# Patient Record
Sex: Female | Born: 1980 | Race: White | Hispanic: No | Marital: Single | Smoking: Former smoker
Health system: Southern US, Community
[De-identification: ages and names within clinical notes are randomized; demographics above are authoritative.]

## PROBLEM LIST (undated history)

## (undated) DIAGNOSIS — K219 Gastro-esophageal reflux disease without esophagitis: Secondary | ICD-10-CM

## (undated) HISTORY — DX: Gastro-esophageal reflux disease without esophagitis: K21.9

---
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
## 1999-10-10 ENCOUNTER — Other Ambulatory Visit: Admission: RE | Admit: 1999-10-10 | Discharge: 1999-10-10 | Payer: Self-pay | Admitting: Obstetrics and Gynecology

## 1999-11-22 ENCOUNTER — Encounter: Payer: Self-pay | Admitting: Obstetrics & Gynecology

## 1999-11-22 ENCOUNTER — Inpatient Hospital Stay (HOSPITAL_COMMUNITY): Admission: AD | Admit: 1999-11-22 | Discharge: 1999-11-22 | Payer: Self-pay | Admitting: Obstetrics & Gynecology

## 2000-05-10 ENCOUNTER — Inpatient Hospital Stay (HOSPITAL_COMMUNITY): Admission: AD | Admit: 2000-05-10 | Discharge: 2000-05-13 | Payer: Self-pay | Admitting: Obstetrics and Gynecology

## 2000-06-24 ENCOUNTER — Other Ambulatory Visit: Admission: RE | Admit: 2000-06-24 | Discharge: 2000-06-24 | Payer: Self-pay | Admitting: Obstetrics and Gynecology

## 2001-03-16 ENCOUNTER — Emergency Department (HOSPITAL_COMMUNITY): Admission: EM | Admit: 2001-03-16 | Discharge: 2001-03-16 | Payer: Self-pay | Admitting: *Deleted

## 2001-03-16 ENCOUNTER — Encounter: Payer: Self-pay | Admitting: *Deleted

## 2005-05-23 ENCOUNTER — Emergency Department (HOSPITAL_COMMUNITY): Admission: EM | Admit: 2005-05-23 | Discharge: 2005-05-23 | Payer: Self-pay | Admitting: Family Medicine

## 2005-12-16 ENCOUNTER — Inpatient Hospital Stay (HOSPITAL_COMMUNITY): Admission: AD | Admit: 2005-12-16 | Discharge: 2005-12-16 | Payer: Self-pay | Admitting: Gynecology

## 2005-12-19 ENCOUNTER — Inpatient Hospital Stay (HOSPITAL_COMMUNITY): Admission: AD | Admit: 2005-12-19 | Discharge: 2005-12-19 | Payer: Self-pay | Admitting: Gynecology

## 2011-04-21 ENCOUNTER — Emergency Department (HOSPITAL_COMMUNITY)
Admission: EM | Admit: 2011-04-21 | Discharge: 2011-04-21 | Disposition: A | Discharge status: Home / Self Care | Payer: BC Managed Care – PPO | Attending: Emergency Medicine | Admitting: Emergency Medicine

## 2011-04-21 ENCOUNTER — Encounter: Payer: Self-pay | Admitting: *Deleted

## 2011-04-21 DIAGNOSIS — R404 Transient alteration of awareness: Secondary | ICD-10-CM | POA: Insufficient documentation

## 2011-04-21 DIAGNOSIS — F10929 Alcohol use, unspecified with intoxication, unspecified: Secondary | ICD-10-CM

## 2011-04-21 DIAGNOSIS — F1010 Alcohol abuse, uncomplicated: Secondary | ICD-10-CM | POA: Insufficient documentation

## 2011-04-21 DIAGNOSIS — R112 Nausea with vomiting, unspecified: Secondary | ICD-10-CM | POA: Insufficient documentation

## 2011-04-21 LAB — RAPID URINE DRUG SCREEN, HOSP PERFORMED
Amphetamines: NOT DETECTED
Benzodiazepines: NOT DETECTED
Cocaine: NOT DETECTED

## 2011-04-21 LAB — ETHANOL: Alcohol, Ethyl (B): 184 mg/dL — ABNORMAL HIGH (ref 0–11)

## 2011-04-21 LAB — PREGNANCY, URINE: Preg Test, Ur: NEGATIVE

## 2011-04-21 MED ORDER — SODIUM CHLORIDE 0.9 % IV BOLUS (SEPSIS)
1000.0000 mL | Freq: Once | INTRAVENOUS | Status: AC
  Administered 2011-04-21: 1000 mL via INTRAVENOUS

## 2011-04-21 MED ORDER — ONDANSETRON HCL 4 MG PO TABS: 4.0000 mg | ORAL_TABLET | Freq: Four times a day (QID) | ORAL | Status: AC

## 2011-04-21 MED ORDER — ONDANSETRON HCL 4 MG/2ML IJ SOLN
4.0000 mg | Freq: Once | INTRAMUSCULAR | Status: AC
  Administered 2011-04-21: 4 mg via INTRAVENOUS
  Filled 2011-04-21: qty 2

## 2011-04-21 NOTE — ED Provider Notes (Signed)
History     CSN: [id number] Arrival date & time: 04/21/2011  3:23 AM   First MD Initiated Contact with Patient 04/21/11 [phone]      Chief Complaint  Patient presents with  . Alcohol Intoxication    (Consider location/radiation/quality/duration/timing/severity/associated sxs/prior treatment) HPI Comments: Patient here after going to a company Christmas party, then going out with co-workers afterwards where she continued to drink an unknown amount of alcohol.  Her sister reports that she normally does not drink a lot and so this is not normal.  She reports that she admitted to drinking shots as well. She states that when she went to pick her up, she was quite intoxicated, she became concerned because the patient was becoming less and less coherent.  Upon arrival here patient with active vomiting while in triage.  I am able to arouse the patient, but she will not hold a conversation.  Patient is a 30 y.o. female presenting with intoxication. The history is provided by a relative. The history is limited by the condition of the patient. No language interpreter was used.  Alcohol Intoxication This is a new problem. The current episode started today. The problem occurs constantly. The problem has been unchanged. Associated symptoms include nausea and vomiting. Pertinent negatives include no abdominal pain. The symptoms are aggravated by drinking. She has tried nothing for the symptoms. The treatment provided no relief.  Alcohol Intoxication This is a new problem. The current episode started today. The problem occurs constantly. The problem has been unchanged. Pertinent negatives include no abdominal pain. The symptoms are aggravated by drinking. She has tried nothing for the symptoms. The treatment provided no relief.    Past Medical History  Diagnosis Date  . Asthma     History reviewed. No pertinent past surgical history.  History reviewed. No pertinent family history.  History  Substance  Use Topics  . Smoking status: Not on file  . Smokeless tobacco: Not on file  . Alcohol Use: Yes    OB History    Grav Para Term Preterm Abortions TAB SAB Ect Mult Living                  Review of Systems  Unable to perform ROS: Mental status change  Gastrointestinal: Positive for nausea and vomiting. Negative for abdominal pain.    Allergies  Review of patient's allergies indicates no known allergies.  Home Medications  No current outpatient prescriptions on file.  BP 106/74  Pulse 95  Temp(Src) 98 F (36.7 C) (Oral)  Resp 18  SpO2 100%  Physical Exam  Nursing note and vitals reviewed. Constitutional: She appears well-developed and well-nourished.  HENT:  Head: Normocephalic and atraumatic.  Right Ear: External ear normal.  Left Ear: External ear normal.  Mouth/Throat: Oropharynx is clear and moist. No oropharyngeal exudate.  Eyes: Conjunctivae are normal. Pupils are equal, round, and reactive to light.  Neck: Normal range of motion. Neck supple.  Cardiovascular: Normal rate, regular rhythm and normal heart sounds.  Exam reveals no gallop and no friction rub.   No murmur heard. Pulmonary/Chest: Effort normal and breath sounds normal. She exhibits no tenderness.  Abdominal: Soft. Bowel sounds are normal. She exhibits no distension. There is no tenderness.  Musculoskeletal: Normal range of motion.  Neurological: No cranial nerve deficit.       somnulent but arouses with painful stimuli  Skin: Skin is warm and dry.    ED Course  Procedures (including critical care time)   Labs  Reviewed  ETHANOL  PREGNANCY, URINE  URINE RAPID DRUG SCREEN (HOSP PERFORMED)   No results found.   Alcohol Intoxication    MDM  Patient now awake and alert, is able to converse and ambulate and clinically sober.  Will discharge home with sister.        Izola Price [place], [place] 04/21/11 [phone]  Medical screening examination/treatment/procedure(s) were performed by  non-physician practitioner and as supervising physician I was immediately available for consultation/collaboration.   Sunnie Nielsen, MD 04/21/11 [phone]

## 2011-04-21 NOTE — ED Notes (Signed)
Pt in with alcohol intoxication, pt actively vomiting in triage, pt unable to sit up or answer questions, unsure of drug use or amount of ETOH

## 2011-05-11 ENCOUNTER — Telehealth (INDEPENDENT_AMBULATORY_CARE_PROVIDER_SITE_OTHER): Payer: Self-pay | Admitting: Physician Assistant

## 2011-05-11 MED ORDER — AZITHROMYCIN 250 MG PO TABS: ORAL_TABLET | ORAL | Status: AC

## 2011-05-11 NOTE — Telephone Encounter (Signed)
Terry Chang is complaining of a three week history of productive cough, worse in the past four days. She had a fever of 104 last night. She denies wheezing or shortness of breath as she has a history of asthma. I examined her in the office today and she has a productive cough but no stridor or increased work of breathing. Lungs show bilateral crackles that clear with coughing. No wheeze or decreased breath sounds. Given her persistent symptoms and recent fever, I will prescribe her a course of azithromycin. If she has worsening or no improvement of symptoms, we will recommend a chest xray.

## 2011-09-18 ENCOUNTER — Ambulatory Visit (INDEPENDENT_AMBULATORY_CARE_PROVIDER_SITE_OTHER): Payer: BC Managed Care – PPO | Admitting: Family Medicine

## 2011-09-18 ENCOUNTER — Encounter: Payer: Self-pay | Admitting: Family Medicine

## 2011-09-18 VITALS — BP 106/64 | HR 85 | Temp 98.3°F | Ht 62.0 in | Wt 130.2 lb

## 2011-09-18 DIAGNOSIS — Z3009 Encounter for other general counseling and advice on contraception: Secondary | ICD-10-CM

## 2011-09-18 DIAGNOSIS — J45909 Unspecified asthma, uncomplicated: Secondary | ICD-10-CM

## 2011-09-18 MED ORDER — BECLOMETHASONE DIPROPIONATE 40 MCG/ACT IN AERS: 2.0000 | INHALATION_SPRAY | Freq: Two times a day (BID) | RESPIRATORY_TRACT | Status: DC

## 2011-09-18 MED ORDER — MONTELUKAST SODIUM 10 MG PO TABS: 10.0000 mg | ORAL_TABLET | Freq: Every day | ORAL | Status: DC

## 2011-09-18 MED ORDER — ALBUTEROL SULFATE HFA 108 (90 BASE) MCG/ACT IN AERS: 2.0000 | INHALATION_SPRAY | Freq: Four times a day (QID) | RESPIRATORY_TRACT | Status: DC | PRN

## 2011-09-18 NOTE — Progress Notes (Signed)
  Subjective:     Terry Chang is a 31 y.o. female who presents for evaluation of asthma. The patient has been previously diagnosed with asthma. @[facility] @ was diagnosed with asthma at age 26 . The patient has been admitted to the hospital for asthma; the last admission was about 1 year ago--[facility]. The patient is not currently have symptoms / an exacerbation. The patient has been having episodes for approximately 2 years. Symptoms in previous episodes have included chest pain, chest tightness, dyspnea, non-productive cough and wheezing, and typically last 2 days. Previous episodes have been triggered by animal dander, dust, pollens and strong odors. Treatments tried during prior episodes include short-acting inhaled beta-adrenergic agonists, which usually provides some relief of symptoms.   Current Disease Severity Terry Chang has monthly daytime asthma symptoms. She has monthly nighttime asthma symptoms. The patient is using short-acting beta agonists for symptom control more than 2 days per week but not more than once a day. She has exacerbations requiring oral systemic corticosteroids 1 times per year. Current limitations in activity from asthma: unable to start exercising again. Number of days of school or work missed in the last month: 0. Number of urgent/emergent visit in last year: 0   The following portions of the patient's history were reviewed and updated as appropriate: allergies, current medications, past family history, past medical history, past social history, past surgical history and problem list.  Review of Systems As above     Objective:    Oxygen saturation 98% on room air BP 106/64  Pulse 85  Temp(Src) 98.3 F (36.8 C) (Oral)  Ht 5\' 2"  (1.575 m)  Wt 130 lb 3.2 oz (59.058 kg)  BMI 23.81 kg/m2  SpO2 98%  LMP 09/01/2011 General appearance: alert, cooperative, appears stated age and no distress Head: Normocephalic, without obvious abnormality, atraumatic Ears: normal TM's and  external ear canals both ears Nose: Nares normal. Septum midline. Mucosa normal. No drainage or sinus tenderness. Throat: lips, mucosa, and tongue normal; teeth and gums normal Neck: no adenopathy, supple, symmetrical, trachea midline and thyroid not enlarged, symmetric, no tenderness/mass/nodules Lungs: clear to auscultation bilaterally Heart: S1, S2 normal Lymph nodes: Cervical, supraclavicular, and axillary nodes normal.    Assessment:    Intermittent asthma. Apparent precipitants include emotional upset, exercise and upper respiratory infection. No treatment was given in the office    contraception--  rto for cpe and we can arrange for IUD if she decides she wants it Plan:    Medications: begin singulair, qvar, ventolin hfa. Discussed distinction between quick-relief and controlled medications. Discussed medication dosage, use, side effects, and goals of treatment in detail.   Discussed technique for using MDIs and/or nebulizer. Discussed monitoring symptoms and use of quick-relief medications and contacting us early in the course of exacerbations. f/u prn

## 2011-09-18 NOTE — Assessment & Plan Note (Signed)
Hospitalized about 1 year ago---- went through rescue inhaler in 2 weeks

## 2011-09-18 NOTE — Patient Instructions (Addendum)
Allergies, Generic Allergies may happen from anything your body is sensitive to. This may be food, medicines, pollens, chemicals, and nearly anything around you in everyday life that produces allergens. An allergen is anything that causes an allergy producing substance. Heredity is often a factor in causing these problems. This means you may have some of the same allergies as your parents. Food allergies happen in all age groups. Food allergies are some of the most severe and life threatening. Some common food allergies are cow's milk, seafood, eggs, nuts, wheat, and soybeans. SYMPTOMS   Swelling around the mouth.   An itchy red rash or hives.   Vomiting or diarrhea.   Difficulty breathing.  SEVERE ALLERGIC REACTIONS ARE LIFE-THREATENING. This reaction is called anaphylaxis. It can cause the mouth and throat to swell and cause difficulty with breathing and swallowing. In severe reactions only a trace amount of food (for example, peanut oil in a salad) may cause death within seconds. Seasonal allergies occur in all age groups. These are seasonal because they usually occur during the same season every year. They may be a reaction to molds, grass pollens, or tree pollens. Other causes of problems are house dust mite allergens, pet dander, and mold spores. The symptoms often consist of nasal congestion, a runny itchy nose associated with sneezing, and tearing itchy eyes. There is often an associated itching of the mouth and ears. The problems happen when you come in contact with pollens and other allergens. Allergens are the particles in the air that the body reacts to with an allergic reaction. This causes you to release allergic antibodies. Through a chain of events, these eventually cause you to release histamine into the blood stream. Although it is meant to be protective to the body, it is this release that causes your discomfort. This is why you were given anti-histamines to feel better. If you are  unable to pinpoint the offending allergen, it may be determined by skin or blood testing. Allergies cannot be cured but can be controlled with medicine. Hay fever is a collection of all or some of the seasonal allergy problems. It may often be treated with simple over-the-counter medicine such as diphenhydramine. Take medicine as directed. Do not drink alcohol or drive while taking this medicine. Check with your caregiver or package insert for child dosages. If these medicines are not effective, there are many new medicines your caregiver can prescribe. Stronger medicine such as nasal spray, eye drops, and corticosteroids may be used if the first things you try do not work well. Other treatments such as immunotherapy or desensitizing injections can be used if all else fails. Follow up with your caregiver if problems continue. These seasonal allergies are usually not life threatening. They are generally more of a nuisance that can often be handled using medicine. HOME CARE INSTRUCTIONS   If unsure what causes a reaction, keep a diary of foods eaten and symptoms that follow. Avoid foods that cause reactions.   If hives or rash are present:   Take medicine as directed.   You may use an over-the-counter antihistamine (diphenhydramine) for hives and itching as needed.   Apply cold compresses (cloths) to the skin or take baths in cool water. Avoid hot baths or showers. Heat will make a rash and itching worse.   If you are severely allergic:   Following a treatment for a severe reaction, hospitalization is often required for closer follow-up.   Wear a medic-alert bracelet or necklace stating the allergy.     You and your family must learn how to give adrenaline or use an anaphylaxis kit.   If you have had a severe reaction, always carry your anaphylaxis kit or EpiPen with you. Use this medicine as directed by your caregiver if a severe reaction is occurring. Failure to do so could have a fatal  outcome.  SEEK MEDICAL CARE IF:  You suspect a food allergy. Symptoms generally happen within 30 minutes of eating a food.   Your symptoms have not gone away within 2 days or are getting worse.   You develop new symptoms.   You want to retest yourself or your child with a food or drink you think causes an allergic reaction. Never do this if an anaphylactic reaction to that food or drink has happened before. Only do this under the care of a caregiver.  SEEK IMMEDIATE MEDICAL CARE IF:   You have difficulty breathing, are wheezing, or have a tight feeling in your chest or throat.   You have a swollen mouth, or you have hives, swelling, or itching all over your body.   You have had a severe reaction that has responded to your anaphylaxis kit or an EpiPen. These reactions may return when the medicine has worn off. These reactions should be considered life threatening.  MAKE SURE YOU:   Understand these instructions.   Will watch your condition.   Will get help right away if you are not doing well or get worse.  Document Released: 07/24/2002 Document Revised: 04/19/2011 Document Reviewed: 12/29/2007 [facility] Patient Information 2012 [place], [place].  Asthma, Adult Asthma is caused by narrowing of the air passages in the lungs. It may be triggered by pollen, dust, animal dander, molds, some foods, respiratory infections, exposure to smoke, exercise, emotional stress or other allergens (things that cause allergic reactions or allergies). Repeat attacks are common. HOME CARE INSTRUCTIONS   Use prescription medications as ordered by your caregiver.   Avoid pollen, dust, animal dander, molds, smoke and other things that cause attacks at home and at work.   You may have fewer attacks if you decrease dust in your home. Electrostatic air cleaners may help.   It may help to replace your pillows or mattress with materials less likely to cause allergies.   Talk to your caregiver about an  action plan for managing asthma attacks at home, including, the use of a peak flow meter which measures the severity of your asthma attack. An action plan can help minimize or stop the attack without having to seek medical care.   If you are not on a fluid restriction, drink 8 to 10 glasses of water each day.   Always have a plan prepared for seeking medical attention, including, calling your physician, accessing local emergency care, and calling 911 (in the [place] for a severe attack.   Discuss possible exercise routines with your caregiver.   If animal dander is the cause of asthma, you may need to get rid of pets.  SEEK MEDICAL CARE IF:   You have wheezing and shortness of breath even if taking medicine to prevent attacks.   You have muscle aches, chest pain or thickening of sputum.   Your sputum changes from clear or white to yellow, green, gray, or bloody.   You have any problems that may be related to the medicine you are taking (such as a rash, itching, swelling or trouble breathing).  SEEK IMMEDIATE MEDICAL CARE IF:   Your usual medicines do not stop your wheezing  or there is increased coughing and/or shortness of breath.   You have increased difficulty breathing.   You have a fever.  MAKE SURE YOU:   Understand these instructions.   Will watch your condition.   Will get help right away if you are not doing well or get worse.  Document Released: 04/30/2005 Document Revised: 04/19/2011 Document Reviewed: 12/17/2007 [facility] Patient Information 2012 [place], [place].  Intrauterine Device Information An intrauterine device (IUD) is inserted into your uterus and prevents pregnancy. There are 2 types of IUDs available:  Copper IUD. This type of IUD is wrapped in copper wire and is placed inside the uterus. Copper makes the uterus and fallopian tubes produce a fluid that kills sperm. The copper IUD can stay in place for 10 years.   Hormone IUD. This type of IUD contains the  hormone progestin (synthetic progesterone). The hormone thickens the cervical mucus and prevents sperm from entering the uterus, and it also thins the uterine lining to prevent implantation of a fertilized egg. The hormone can weaken or kill the sperm that get into the uterus. The hormone IUD can stay in place for 5 years.  Your caregiver will make sure you are a good candidate for a contraceptive IUD. Discuss with your caregiver the possible side effects. ADVANTAGES  It is highly effective, reversible, long-acting, and low maintenance.   There are no estrogen-related side effects.   An IUD can be used when breastfeeding.   It is not associated with weight gain.   It works immediately after insertion.   The copper IUD does not interfere with your female hormones.   The progesterone IUD can make heavy menstrual periods lighter.   The progesterone IUD can be used for 5 years.   The copper IUD can be used for 10 years.  DISADVANTAGES  The progesterone IUD can be associated with irregular bleeding patterns.   The copper IUD can make your menstrual flow heavier and more painful.   You may experience cramping and vaginal bleeding after insertion.  Document Released: 04/03/2004 Document Revised: 04/19/2011 Document Reviewed: 09/02/2010 [facility] Patient Information 2012 [place], [place].

## 2011-11-14 ENCOUNTER — Encounter: Payer: Self-pay | Admitting: Family Medicine

## 2011-11-14 ENCOUNTER — Ambulatory Visit (INDEPENDENT_AMBULATORY_CARE_PROVIDER_SITE_OTHER): Payer: BC Managed Care – PPO | Admitting: Family Medicine

## 2011-11-14 ENCOUNTER — Other Ambulatory Visit (HOSPITAL_COMMUNITY)
Admission: RE | Admit: 2011-11-14 | Discharge: 2011-11-14 | Disposition: A | Discharge status: Home / Self Care | Payer: BC Managed Care – PPO | Source: Ambulatory Visit | Attending: Family Medicine | Admitting: Family Medicine

## 2011-11-14 VITALS — BP 104/62 | HR 67 | Temp 98.3°F | Ht 62.0 in | Wt 128.2 lb

## 2011-11-14 DIAGNOSIS — Z0000 Encounter for general adult medical examination without abnormal findings: Secondary | ICD-10-CM

## 2011-11-14 DIAGNOSIS — IMO0001 Reserved for inherently not codable concepts without codable children: Secondary | ICD-10-CM

## 2011-11-14 DIAGNOSIS — Z124 Encounter for screening for malignant neoplasm of cervix: Secondary | ICD-10-CM

## 2011-11-14 DIAGNOSIS — Z309 Encounter for contraceptive management, unspecified: Secondary | ICD-10-CM

## 2011-11-14 DIAGNOSIS — Z01419 Encounter for gynecological examination (general) (routine) without abnormal findings: Secondary | ICD-10-CM | POA: Insufficient documentation

## 2011-11-14 LAB — CBC WITH DIFFERENTIAL/PLATELET
Basophils Absolute: 0 10*3/uL (ref 0.0–0.1)
Eosinophils Absolute: 0.1 10*3/uL (ref 0.0–0.7)
Hemoglobin: 12.8 g/dL (ref 12.0–15.0)
Lymphocytes Relative: 35.9 % (ref 12.0–46.0)
MCHC: 33.4 g/dL (ref 30.0–36.0)
Monocytes Relative: 5.9 % (ref 3.0–12.0)
Neutro Abs: 2.5 10*3/uL (ref 1.4–7.7)
Neutrophils Relative %: 55.6 % (ref 43.0–77.0)
Platelets: 301 10*3/uL (ref 150.0–400.0)
RDW: 12.4 % (ref 11.5–14.6)

## 2011-11-14 LAB — BASIC METABOLIC PANEL
BUN: 10 mg/dL (ref 6–23)
CO2: 28 mEq/L (ref 19–32)
Calcium: 9.5 mg/dL (ref 8.4–10.5)
Creatinine, Ser: 0.8 mg/dL (ref 0.4–1.2)
GFR: 84.17 mL/min (ref >60.00)
Glucose, Bld: 84 mg/dL (ref 70–99)
Sodium: 137 mEq/L (ref 135–145)

## 2011-11-14 LAB — POCT URINALYSIS DIPSTICK
Glucose, UA: NEGATIVE
Nitrite, UA: NEGATIVE
Protein, UA: NEGATIVE
Spec Grav, UA: 1.015
Urobilinogen, UA: 0.2

## 2011-11-14 LAB — HEPATIC FUNCTION PANEL
AST: 45 U/L — ABNORMAL HIGH (ref 0–37)
Albumin: 4.2 g/dL (ref 3.5–5.2)
Alkaline Phosphatase: 59 U/L (ref 39–117)
Bilirubin, Direct: 0.1 mg/dL (ref 0.0–0.3)
Total Bilirubin: 1.5 mg/dL — ABNORMAL HIGH (ref 0.3–1.2)

## 2011-11-14 LAB — TSH: TSH: 1.48 u[IU]/mL (ref 0.35–5.50)

## 2011-11-14 LAB — LIPID PANEL
HDL: 85.6 mg/dL (ref >39.00)
Total CHOL/HDL Ratio: 2
VLDL: 18.8 mg/dL (ref 0.0–40.0)

## 2011-11-14 NOTE — Progress Notes (Signed)
Subjective:     Terry Chang is a 31 y.o. female and is here for a comprehensive physical exam. The patient reports no problems.  History   Social History  . Marital Status: Divorced    Spouse Name: N/A    Number of Children: N/A  . Years of Education: N/A   Occupational History  . c[employer]   Social History Main Topics  . Smoking status: Never Smoker   . Smokeless tobacco: Never Used  . Alcohol Use: Yes     OCC  . Drug Use: No  . Sexually Active: Yes -- Female partner(s)    Birth Control/ Protection: None   Other Topics Concern  . Not on file   Social History Narrative   Exercise---  Walking and weights   Health Maintenance  Topic Date Due  . Influenza Vaccine  02/12/2012  . Pap Smear  11/14/2014  . Tetanus/tdap  09/18/2019    The following portions of the patient's history were reviewed and updated as appropriate: allergies, current medications, past family history, past medical history, past social history, past surgical history and problem list.  Review of Systems Review of Systems  Constitutional: Negative for activity change, appetite change and fatigue.  HENT: Negative for hearing loss, congestion, tinnitus and ear discharge.  dentist q13m Eyes: Negative for visual disturbance (see optho due).  Respiratory: Negative for cough, chest tightness and shortness of breath.   Cardiovascular: Negative for chest pain, palpitations and leg swelling.  Gastrointestinal: Negative for abdominal pain, diarrhea, constipation and abdominal distention.  Genitourinary: Negative for urgency, frequency, decreased urine volume and difficulty urinating.  Musculoskeletal: Negative for back pain, arthralgias and gait problem.  Skin: Negative for color change, pallor and rash.  Neurological: Negative for dizziness, light-headedness, numbness and headaches.  Hematological: Negative for adenopathy. Does not bruise/bleed easily.  Psychiatric/Behavioral: Negative for  suicidal ideas, confusion, sleep disturbance, self-injury, dysphoric mood, decreased concentration and agitation.    ]   Objective:    BP 104/62  Pulse 67  Temp 98.3 F (36.8 C) (Oral)  Ht 5\' 2"  (1.575 m)  Wt 128 lb 3.2 oz (58.151 kg)  BMI 23.45 kg/m2  SpO2 97%  LMP 11/03/2011 General appearance: alert, cooperative, appears stated age and no distress Head: Normocephalic, without obvious abnormality, atraumatic Eyes: conjunctivae/corneas clear. PERRL, EOM's intact. Fundi benign. Ears: normal TM's and external ear canals both ears Nose: Nares normal. Septum midline. Mucosa normal. No drainage or sinus tenderness. Throat: lips, mucosa, and tongue normal; teeth and gums normal Neck: no adenopathy, no carotid bruit, no JVD, supple, symmetrical, trachea midline and thyroid not enlarged, symmetric, no tenderness/mass/nodules Back: symmetric, no curvature. ROM normal. No CVA tenderness. Lungs: clear to auscultation bilaterally Breasts: normal appearance, no masses or tenderness Heart: regular rate and rhythm, S1, S2 normal, no murmur, click, rub or gallop Abdomen: soft, non-tender; bowel sounds normal; no masses,  no organomegaly Pelvic: cervix normal in appearance, external genitalia normal, no adnexal masses or tenderness, no cervical motion tenderness, rectovaginal septum normal, uterus normal size, shape, and consistency and vagina normal without discharge Extremities: extremities normal, atraumatic, no cyanosis or edema Pulses: 2+ and symmetric Skin: Skin color, texture, turgor normal. No rashes or lesions Lymph nodes: Cervical, supraclavicular, and axillary nodes normal. Neurologic: Alert and oriented X 3, normal strength and tone. Normal symmetric reflexes. Normal coordination and gait psych---no deprssion, no anxiety    Assessment:    Healthy female exam.     asthma--stable Plan:  ghm utd Check labs See After Visit Summary for Counseling Recommendations

## 2011-11-14 NOTE — Patient Instructions (Addendum)
Preventive Care for Adults, Female A healthy lifestyle and preventive care can promote health and wellness. Preventive health guidelines for women include the following key practices.  A routine yearly physical is a good way to check with your caregiver about your health and preventive screening. It is a chance to share any concerns and updates on your health, and to receive a thorough exam.   Visit your dentist for a routine exam and preventive care every 6 months. Brush your teeth twice a day and floss once a day. Good oral hygiene prevents tooth decay and gum disease.   The frequency of eye exams is based on your age, health, family medical history, use of contact lenses, and other factors. Follow your caregiver's recommendations for frequency of eye exams.   Eat a healthy diet. Foods like vegetables, fruits, whole grains, low-fat dairy products, and lean protein foods contain the nutrients you need without too many calories. Decrease your intake of foods high in solid fats, added sugars, and salt. Eat the right amount of calories for you.Get information about a proper diet from your caregiver, if necessary.   Regular physical exercise is one of the most important things you can do for your health. Most adults should get at least 150 minutes of moderate-intensity exercise (any activity that increases your heart rate and causes you to sweat) each week. In addition, most adults need muscle-strengthening exercises on 2 or more days a week.   Maintain a healthy weight. The body mass index (BMI) is a screening tool to identify possible weight problems. It provides an estimate of body fat based on height and weight. Your caregiver can help determine your BMI, and can help you achieve or maintain a healthy weight.For adults 20 years and older:   A BMI below 18.5 is considered underweight.   A BMI of 18.5 to 24.9 is normal.   A BMI of 25 to 29.9 is considered overweight.   A BMI of 30 and above is  considered obese.   Maintain normal blood lipids and cholesterol levels by exercising and minimizing your intake of saturated fat. Eat a balanced diet with plenty of fruit and vegetables. Blood tests for lipids and cholesterol should begin at age 20 and be repeated every 5 years. If your lipid or cholesterol levels are high, you are over 50, or you are at high risk for heart disease, you may need your cholesterol levels checked more frequently.Ongoing high lipid and cholesterol levels should be treated with medicines if diet and exercise are not effective.   If you smoke, find out from your caregiver how to quit. If you do not use tobacco, do not start.   If you are pregnant, do not drink alcohol. If you are breastfeeding, be very cautious about drinking alcohol. If you are not pregnant and choose to drink alcohol, do not exceed 1 drink per day. One drink is considered to be 12 ounces (355 mL) of beer, 5 ounces (148 mL) of wine, or 1.5 ounces (44 mL) of liquor.   Avoid use of street drugs. Do not share needles with anyone. Ask for help if you need support or instructions about stopping the use of drugs.   High blood pressure causes heart disease and increases the risk of stroke. Your blood pressure should be checked at least every 1 to 2 years. Ongoing high blood pressure should be treated with medicines if weight loss and exercise are not effective.   If you are 55 to 33   years old, ask your caregiver if you should take aspirin to prevent strokes.   Diabetes screening involves taking a blood sample to check your fasting blood sugar level. This should be done once every 3 years, after age 45, if you are within normal weight and without risk factors for diabetes. Testing should be considered at a younger age or be carried out more frequently if you are overweight and have at least 1 risk factor for diabetes.   Breast cancer screening is essential preventive care for women. You should practice "breast  self-awareness." This means understanding the normal appearance and feel of your breasts and may include breast self-examination. Any changes detected, no matter how small, should be reported to a caregiver. Women in their 20s and 30s should have a clinical breast exam (CBE) by a caregiver as part of a regular health exam every 1 to 3 years. After age 40, women should have a CBE every year. Starting at age 40, women should consider having a mammography (breast X-ray test) every year. Women who have a family history of breast cancer should talk to their caregiver about genetic screening. Women at a high risk of breast cancer should talk to their caregivers about having magnetic resonance imaging (MRI) and a mammography every year.   The Pap test is a screening test for cervical cancer. A Pap test can show cell changes on the cervix that might become cervical cancer if left untreated. A Pap test is a procedure in which cells are obtained and examined from the lower end of the uterus (cervix).   Women should have a Pap test starting at age 21.   Between ages 21 and 29, Pap tests should be repeated every 2 years.   Beginning at age 30, you should have a Pap test every 3 years as long as the past 3 Pap tests have been normal.   Some women have medical problems that increase the chance of getting cervical cancer. Talk to your caregiver about these problems. It is especially important to talk to your caregiver if a new problem develops soon after your last Pap test. In these cases, your caregiver may recommend more frequent screening and Pap tests.   The above recommendations are the same for women who have or have not gotten the vaccine for human papillomavirus (HPV).   If you had a hysterectomy for a problem that was not cancer or a condition that could lead to cancer, then you no longer need Pap tests. Even if you no longer need a Pap test, a regular exam is a good idea to make sure no other problems are  starting.   If you are between ages 65 and 70, and you have had normal Pap tests going back 10 years, you no longer need Pap tests. Even if you no longer need a Pap test, a regular exam is a good idea to make sure no other problems are starting.   If you have had past treatment for cervical cancer or a condition that could lead to cancer, you need Pap tests and screening for cancer for at least 20 years after your treatment.   If Pap tests have been discontinued, risk factors (such as a new sexual partner) need to be reassessed to determine if screening should be resumed.   The HPV test is an additional test that may be used for cervical cancer screening. The HPV test looks for the virus that can cause the cell changes on the cervix.   The cells collected during the Pap test can be tested for HPV. The HPV test could be used to screen women aged 30 years and older, and should be used in women of any age who have unclear Pap test results. After the age of 30, women should have HPV testing at the same frequency as a Pap test.   Colorectal cancer can be detected and often prevented. Most routine colorectal cancer screening begins at the age of 50 and continues through age 75. However, your caregiver may recommend screening at an earlier age if you have risk factors for colon cancer. On a yearly basis, your caregiver may provide home test kits to check for hidden blood in the stool. Use of a small camera at the end of a tube, to directly examine the colon (sigmoidoscopy or colonoscopy), can detect the earliest forms of colorectal cancer. Talk to your caregiver about this at age 50, when routine screening begins. Direct examination of the colon should be repeated every 5 to 10 years through age 75, unless early forms of pre-cancerous polyps or small growths are found.   Hepatitis C blood testing is recommended for all people born from 1945 through 1965 and any individual with known risks for hepatitis C.    Practice safe sex. Use condoms and avoid high-risk sexual practices to reduce the spread of sexually transmitted infections (STIs). STIs include gonorrhea, chlamydia, syphilis, trichomonas, herpes, HPV, and human immunodeficiency virus (HIV). Herpes, HIV, and HPV are viral illnesses that have no cure. They can result in disability, cancer, and death. Sexually active women aged 25 and younger should be checked for chlamydia. Older women with new or multiple partners should also be tested for chlamydia. Testing for other STIs is recommended if you are sexually active and at increased risk.   Osteoporosis is a disease in which the bones lose minerals and strength with aging. This can result in serious bone fractures. The risk of osteoporosis can be identified using a bone density scan. Women ages 65 and over and women at risk for fractures or osteoporosis should discuss screening with their caregivers. Ask your caregiver whether you should take a calcium supplement or vitamin D to reduce the rate of osteoporosis.   Menopause can be associated with physical symptoms and risks. Hormone replacement therapy is available to decrease symptoms and risks. You should talk to your caregiver about whether hormone replacement therapy is right for you.   Use sunscreen with sun protection factor (SPF) of 30 or more. Apply sunscreen liberally and repeatedly throughout the day. You should seek shade when your shadow is shorter than you. Protect yourself by wearing long sleeves, pants, a wide-brimmed hat, and sunglasses year round, whenever you are outdoors.   Once a month, do a whole body skin exam, using a mirror to look at the skin on your back. Notify your caregiver of new moles, moles that have irregular borders, moles that are larger than a pencil eraser, or moles that have changed in shape or color.   Stay current with required immunizations.   Influenza. You need a dose every fall (or winter). The composition of  the flu vaccine changes each year, so being vaccinated once is not enough.   Pneumococcal polysaccharide. You need 1 to 2 doses if you smoke cigarettes or if you have certain chronic medical conditions. You need 1 dose at age 65 (or older) if you have never been vaccinated.   Tetanus, diphtheria, pertussis (Tdap, Td). Get 1 dose of   Tdap vaccine if you are younger than age 65, are over 65 and have contact with an infant, are a healthcare worker, are pregnant, or simply want to be protected from whooping cough. After that, you need a Td booster dose every 10 years. Consult your caregiver if you have not had at least 3 tetanus and diphtheria-containing shots sometime in your life or have a deep or dirty wound.   HPV. You need this vaccine if you are a woman age 26 or younger. The vaccine is given in 3 doses over 6 months.   Measles, mumps, rubella (MMR). You need at least 1 dose of MMR if you were born in 1957 or later. You may also need a second dose.   Meningococcal. If you are age 19 to 21 and a first-year college student living in a residence hall, or have one of several medical conditions, you need to get vaccinated against meningococcal disease. You may also need additional booster doses.   Zoster (shingles). If you are age 60 or older, you should get this vaccine.   Varicella (chickenpox). If you have never had chickenpox or you were vaccinated but received only 1 dose, talk to your caregiver to find out if you need this vaccine.   Hepatitis A. You need this vaccine if you have a specific risk factor for hepatitis A virus infection or you simply wish to be protected from this disease. The vaccine is usually given as 2 doses, 6 to 18 months apart.   Hepatitis B. You need this vaccine if you have a specific risk factor for hepatitis B virus infection or you simply wish to be protected from this disease. The vaccine is given in 3 doses, usually over 6 months.  Preventive Services /  Frequency Ages 19 to 39  Blood pressure check.** / Every 1 to 2 years.   Lipid and cholesterol check.** / Every 5 years beginning at age 20.   Clinical breast exam.** / Every 3 years for women in their 20s and 30s.   Pap test.** / Every 2 years from ages 21 through 29. Every 3 years starting at age 30 through age 65 or 70 with a history of 3 consecutive normal Pap tests.   HPV screening.** / Every 3 years from ages 30 through ages 65 to 70 with a history of 3 consecutive normal Pap tests.   Hepatitis C blood test.** / For any individual with known risks for hepatitis C.   Skin self-exam. / Monthly.   Influenza immunization.** / Every year.   Pneumococcal polysaccharide immunization.** / 1 to 2 doses if you smoke cigarettes or if you have certain chronic medical conditions.   Tetanus, diphtheria, pertussis (Tdap, Td) immunization. / A one-time dose of Tdap vaccine. After that, you need a Td booster dose every 10 years.   HPV immunization. / 3 doses over 6 months, if you are 26 and younger.   Measles, mumps, rubella (MMR) immunization. / You need at least 1 dose of MMR if you were born in 1957 or later. You may also need a second dose.   Meningococcal immunization. / 1 dose if you are age 19 to 21 and a first-year college student living in a residence hall, or have one of several medical conditions, you need to get vaccinated against meningococcal disease. You may also need additional booster doses.   Varicella immunization.** / Consult your caregiver.   Hepatitis A immunization.** / Consult your caregiver. 2 doses, 6 to 18 months   apart.   Hepatitis B immunization.** / Consult your caregiver. 3 doses usually over 6 months.  Ages 40 to 64  Blood pressure check.** / Every 1 to 2 years.   Lipid and cholesterol check.** / Every 5 years beginning at age 20.   Clinical breast exam.** / Every year after age 40.   Mammogram.** / Every year beginning at age 40 and continuing for as  long as you are in good health. Consult with your caregiver.   Pap test.** / Every 3 years starting at age 30 through age 65 or 70 with a history of 3 consecutive normal Pap tests.   HPV screening.** / Every 3 years from ages 30 through ages 65 to 70 with a history of 3 consecutive normal Pap tests.   Fecal occult blood test (FOBT) of stool. / Every year beginning at age 50 and continuing until age 75. You may not need to do this test if you get a colonoscopy every 10 years.   Flexible sigmoidoscopy or colonoscopy.** / Every 5 years for a flexible sigmoidoscopy or every 10 years for a colonoscopy beginning at age 50 and continuing until age 75.   Hepatitis C blood test.** / For all people born from 1945 through 1965 and any individual with known risks for hepatitis C.   Skin self-exam. / Monthly.   Influenza immunization.** / Every year.   Pneumococcal polysaccharide immunization.** / 1 to 2 doses if you smoke cigarettes or if you have certain chronic medical conditions.   Tetanus, diphtheria, pertussis (Tdap, Td) immunization.** / A one-time dose of Tdap vaccine. After that, you need a Td booster dose every 10 years.   Measles, mumps, rubella (MMR) immunization. / You need at least 1 dose of MMR if you were born in 1957 or later. You may also need a second dose.   Varicella immunization.** / Consult your caregiver.   Meningococcal immunization.** / Consult your caregiver.   Hepatitis A immunization.** / Consult your caregiver. 2 doses, 6 to 18 months apart.   Hepatitis B immunization.** / Consult your caregiver. 3 doses, usually over 6 months.  Ages 65 and over  Blood pressure check.** / Every 1 to 2 years.   Lipid and cholesterol check.** / Every 5 years beginning at age 20.   Clinical breast exam.** / Every year after age 40.   Mammogram.** / Every year beginning at age 40 and continuing for as long as you are in good health. Consult with your caregiver.   Pap test.** /  Every 3 years starting at age 30 through age 65 or 70 with a 3 consecutive normal Pap tests. Testing can be stopped between 65 and 70 with 3 consecutive normal Pap tests and no abnormal Pap or HPV tests in the past 10 years.   HPV screening.** / Every 3 years from ages 30 through ages 65 or 70 with a history of 3 consecutive normal Pap tests. Testing can be stopped between 65 and 70 with 3 consecutive normal Pap tests and no abnormal Pap or HPV tests in the past 10 years.   Fecal occult blood test (FOBT) of stool. / Every year beginning at age 50 and continuing until age 75. You may not need to do this test if you get a colonoscopy every 10 years.   Flexible sigmoidoscopy or colonoscopy.** / Every 5 years for a flexible sigmoidoscopy or every 10 years for a colonoscopy beginning at age 50 and continuing until age 75.   Hepatitis   C blood test.** / For all people born from 1945 through 1965 and any individual with known risks for hepatitis C.   Osteoporosis screening.** / A one-time screening for women ages 65 and over and women at risk for fractures or osteoporosis.   Skin self-exam. / Monthly.   Influenza immunization.** / Every year.   Pneumococcal polysaccharide immunization.** / 1 dose at age 65 (or older) if you have never been vaccinated.   Tetanus, diphtheria, pertussis (Tdap, Td) immunization. / A one-time dose of Tdap vaccine if you are over 65 and have contact with an infant, are a healthcare worker, or simply want to be protected from whooping cough. After that, you need a Td booster dose every 10 years.   Varicella immunization.** / Consult your caregiver.   Meningococcal immunization.** / Consult your caregiver.   Hepatitis A immunization.** / Consult your caregiver. 2 doses, 6 to 18 months apart.   Hepatitis B immunization.** / Check with your caregiver. 3 doses, usually over 6 months.  ** Family history and personal history of risk and conditions may change your caregiver's  recommendations. Document Released: 06/26/2001 Document Revised: 04/19/2011 Document Reviewed: 09/25/2010 Exit[employer] Patient Information 2012 [employer]. 

## 2011-11-22 DIAGNOSIS — R7989 Other specified abnormal findings of blood chemistry: Secondary | ICD-10-CM

## 2011-11-22 DIAGNOSIS — R109 Unspecified abdominal pain: Secondary | ICD-10-CM

## 2011-11-26 ENCOUNTER — Other Ambulatory Visit (HOSPITAL_BASED_OUTPATIENT_CLINIC_OR_DEPARTMENT_OTHER): Payer: BC Managed Care – PPO

## 2011-11-27 ENCOUNTER — Ambulatory Visit (HOSPITAL_BASED_OUTPATIENT_CLINIC_OR_DEPARTMENT_OTHER)
Admission: RE | Admit: 2011-11-27 | Discharge: 2011-11-27 | Disposition: A | Discharge status: Home / Self Care | Payer: BC Managed Care – PPO | Source: Ambulatory Visit | Attending: Family Medicine | Admitting: Family Medicine

## 2011-11-27 DIAGNOSIS — R109 Unspecified abdominal pain: Secondary | ICD-10-CM | POA: Insufficient documentation

## 2011-11-27 DIAGNOSIS — R7989 Other specified abnormal findings of blood chemistry: Secondary | ICD-10-CM

## 2012-01-25 ENCOUNTER — Encounter: Payer: Self-pay | Admitting: Gastroenterology

## 2012-02-22 ENCOUNTER — Ambulatory Visit (INDEPENDENT_AMBULATORY_CARE_PROVIDER_SITE_OTHER): Payer: BC Managed Care – PPO | Admitting: Gastroenterology

## 2012-02-22 ENCOUNTER — Encounter: Payer: Self-pay | Admitting: Gastroenterology

## 2012-02-22 VITALS — BP 96/68 | HR 64 | Ht 62.5 in | Wt 127.6 lb

## 2012-02-22 DIAGNOSIS — K219 Gastro-esophageal reflux disease without esophagitis: Secondary | ICD-10-CM

## 2012-02-22 MED ORDER — ESOMEPRAZOLE MAGNESIUM 40 MG PO CPDR: 40.0000 mg | DELAYED_RELEASE_CAPSULE | Freq: Every day | ORAL | Status: DC

## 2012-02-22 NOTE — Patient Instructions (Addendum)
Samples of PPI given, take one pill once daily 20-30 min before BF. Call in 3-4 weeks to report one your symptoms.  If no better, then EGD.

## 2012-02-22 NOTE — Progress Notes (Signed)
HPI: This is a     very pleasant 31 year old woman whom I am meeting for the first time today.  Works at [employer] with [employer] and [employer].  Has bubble in throat, no pyrosis, sometimes acid taste in mouth.  Bubble is constant, started with otc omeprazole 2 weeks.  Takes it in the AM before BF, usually about an hour prior.  It helped mostly, but not completely. She still feels the bubble about once a week and if she misses  Overall weight down a bit, intentionally.  Drinks rare caffeine.  Drinks 3-4 beers a week. Rare peppermint, chocolate.  Eats dinner around 7pm, usually sitting down around 9pm.  This really started about 2 weeks ago.  Woke up one AM and it was a problem.  Took NSAIDS after IUD 1-2 months ago but none since  No dypshagia.   Review of systems: Pertinent positive and negative review of systems were noted in the above HPI section. Complete review of systems was performed and was otherwise normal.    Past Medical History  Diagnosis Date  . Asthma     History reviewed. No pertinent past surgical history.  Current Outpatient Prescriptions  Medication Sig Dispense Refill  . albuterol (VENTOLIN HFA) 108 (90 BASE) MCG/ACT inhaler Inhale 2 puffs into the lungs every 6 (six) hours as needed for wheezing.      . beclomethasone (QVAR) 40 MCG/ACT inhaler Inhale 2 puffs into the lungs 2 (two) times daily.  1 Inhaler  12  . montelukast (SINGULAIR) 10 MG tablet Take 1 tablet (10 mg total) by mouth at bedtime.  30 tablet  11  . Nutritional Supplements (RA MELATONIN/B-6 PO) Take 1 tablet by mouth at bedtime.      Marland Kitchen omeprazole (PRILOSEC OTC) 20 MG tablet Take 20 mg by mouth daily.        Allergies as of 02/22/2012  . (No Known Allergies)    Family History  Problem Relation Age of Onset  . Alcohol abuse Father   . Breast cancer Maternal Aunt     Mothers aunt  . Prostate cancer Maternal Grandfather   . Heart disease Paternal Grandfather   . Heart attack Paternal Grandfather      History   Social History  . Marital Status: Divorced    Spouse Name: N/A    Number of Children: 2  . Years of Education: N/A   Occupational History  . cma [employer]   Social History Main Topics  . Smoking status: Former Games developer  . Smokeless tobacco: Never Used  . Alcohol Use: Yes     OCC  . Drug Use: No  . Sexually Active: Yes -- Female partner(s)    Birth Control/ Protection: None   Other Topics Concern  . Not on file   Social History Narrative   Exercise---  Walking and weights       Physical Exam: BP 96/68  Pulse 64  Ht 5' 2.5" (1.588 m)  Wt 127 lb 9.6 oz (57.879 kg)  BMI 22.97 kg/m2  LMP 02/06/2012 Constitutional: generally well-appearing Psychiatric: alert and oriented x3 Eyes: extraocular movements intact Mouth: oral pharynx moist, no lesions Neck: supple no lymphadenopathy Cardiovascular: heart regular rate and rhythm Lungs: clear to auscultation bilaterally Abdomen: soft, nontender, nondistended, no obvious ascites, no peritoneal signs, normal bowel sounds Extremities: no lower extremity edema bilaterally Skin: no lesions on visible extremities    Assessment and plan: 31 y.o. female with  recent GERD, somewhat of a globus sensation  She has no dysphasia, no overt GI bleeding, only intentional weight loss. I suspect she is having problems with GERD currently. Proton pump inhibitor over-the-counter helps about 80% for her. I'm going to give her prescription strength samples that she will take 1 pill once every morning before breakfast. She will call to report on her symptoms in 3-4 weeks. If she has not improved then I think we should proceed with EGD.

## 2012-02-29 ENCOUNTER — Other Ambulatory Visit: Payer: Self-pay | Admitting: Gastroenterology

## 2012-02-29 MED ORDER — ESOMEPRAZOLE MAGNESIUM 40 MG PO CPDR: 40.0000 mg | DELAYED_RELEASE_CAPSULE | Freq: Every day | ORAL | Status: DC

## 2012-02-29 NOTE — Telephone Encounter (Signed)
rx sent to the pharmacy. 

## 2012-03-03 ENCOUNTER — Telehealth: Payer: Self-pay | Admitting: Gastroenterology

## 2012-03-03 MED ORDER — ESOMEPRAZOLE MAGNESIUM 40 MG PO CPDR: 40.0000 mg | DELAYED_RELEASE_CAPSULE | Freq: Every day | ORAL | Status: DC

## 2012-03-03 NOTE — Telephone Encounter (Signed)
rx sent, the rx sent on Friday was marked as sample and not sent pt aware

## 2012-03-06 ENCOUNTER — Telehealth: Payer: Self-pay | Admitting: Gastroenterology

## 2012-03-06 MED ORDER — OMEPRAZOLE MAGNESIUM 20 MG PO TBEC: 40.0000 mg | DELAYED_RELEASE_TABLET | Freq: Every day | ORAL | Status: DC

## 2012-03-06 NOTE — Telephone Encounter (Signed)
Pt aware omeprazole has been sent

## 2012-06-28 ENCOUNTER — Other Ambulatory Visit: Payer: Self-pay

## 2012-08-20 ENCOUNTER — Encounter: Payer: Self-pay | Admitting: Family Medicine

## 2012-08-20 ENCOUNTER — Ambulatory Visit (INDEPENDENT_AMBULATORY_CARE_PROVIDER_SITE_OTHER): Payer: BC Managed Care – PPO | Admitting: Family Medicine

## 2012-08-20 VITALS — BP 112/74 | HR 94 | Temp 98.3°F | Wt 127.2 lb

## 2012-08-20 DIAGNOSIS — R232 Flushing: Secondary | ICD-10-CM | POA: Insufficient documentation

## 2012-08-20 DIAGNOSIS — R7401 Elevation of levels of liver transaminase levels: Secondary | ICD-10-CM

## 2012-08-20 DIAGNOSIS — N951 Menopausal and female climacteric states: Secondary | ICD-10-CM

## 2012-08-20 DIAGNOSIS — R748 Abnormal levels of other serum enzymes: Secondary | ICD-10-CM

## 2012-08-20 DIAGNOSIS — G4700 Insomnia, unspecified: Secondary | ICD-10-CM | POA: Insufficient documentation

## 2012-08-20 LAB — CBC WITH DIFFERENTIAL/PLATELET
Basophils Absolute: 0 10*3/uL (ref 0.0–0.1)
Basophils Relative: 0.5 % (ref 0.0–3.0)
Eosinophils Absolute: 0.2 10*3/uL (ref 0.0–0.7)
Hemoglobin: 12.6 g/dL (ref 12.0–15.0)
MCHC: 34 g/dL (ref 30.0–36.0)
MCV: 92.7 fl (ref 78.0–100.0)
Monocytes Absolute: 0.2 10*3/uL (ref 0.1–1.0)
Neutro Abs: 1.9 10*3/uL (ref 1.4–7.7)
RBC: 3.99 Mil/uL (ref 3.87–5.11)
RDW: 12.7 % (ref 11.5–14.6)

## 2012-08-20 LAB — BASIC METABOLIC PANEL
BUN: 12 mg/dL (ref 6–23)
Calcium: 9 mg/dL (ref 8.4–10.5)
Creatinine, Ser: 0.8 mg/dL (ref 0.4–1.2)
GFR: 90.01 mL/min (ref >60.00)

## 2012-08-20 LAB — HEPATIC FUNCTION PANEL: Total Bilirubin: 0.8 mg/dL (ref 0.3–1.2)

## 2012-08-20 MED ORDER — ZOLPIDEM TARTRATE 5 MG PO TABS: 5.0000 mg | ORAL_TABLET | Freq: Every evening | ORAL | Status: DC | PRN

## 2012-08-20 NOTE — Progress Notes (Signed)
  Subjective:    Patient ID: Terry Chang, female    DOB: 29-Mar-1981, 32 y.o.   MRN: [id number]  HPI Pt here c/o hot flashes that can occur anytime but mostly occur at night.  Her mother went through menopause in her 30s and completely stopped by 40.  Pt states it is not unbearable and she just got the Mirena about 1 year ago so she is not ready to have it taken out.   She is mostly having problems with insomnia secondary to stress at work.  They added a 3rd Dr for her to take care of and her mind is nonstop at night.   No other complaints.   Review of Systems As above    Objective:   Physical Exam BP 112/74  Pulse 94  Temp(Src) 98.3 F (36.8 C) (Oral)  Wt 127 lb 3.2 oz (57.698 kg)  BMI 22.88 kg/m2  SpO2 97% General appearance: alert, cooperative, appears stated age and no distress Lungs: clear to auscultation bilaterally Heart: S1, S2 normal Psych-- no anxiety, no depression       Assessment & Plan:

## 2012-08-20 NOTE — Assessment & Plan Note (Signed)
ambien 5 mg for about 1 week then 3-4 x a week Consider more long term tx for stress/ anxiety if no better

## 2012-08-20 NOTE — Patient Instructions (Signed)

## 2012-08-20 NOTE — Assessment & Plan Note (Signed)
Pt not ready to d/c mirena  Unable to do labs secondary to hormones Pt would like to wait on evaluating this for now

## 2012-09-19 ENCOUNTER — Other Ambulatory Visit: Payer: Self-pay | Admitting: Family Medicine

## 2013-03-19 ENCOUNTER — Other Ambulatory Visit: Payer: Self-pay

## 2013-08-19 ENCOUNTER — Other Ambulatory Visit: Payer: Self-pay | Admitting: Family Medicine

## 2013-08-19 DIAGNOSIS — G4700 Insomnia, unspecified: Secondary | ICD-10-CM

## 2013-08-19 MED ORDER — ZOLPIDEM TARTRATE 5 MG PO TABS: 5.0000 mg | ORAL_TABLET | Freq: Every evening | ORAL | Status: DC | PRN

## 2013-08-19 MED ORDER — ALBUTEROL SULFATE HFA 108 (90 BASE) MCG/ACT IN AERS: 2.0000 | INHALATION_SPRAY | Freq: Four times a day (QID) | RESPIRATORY_TRACT | Status: DC | PRN

## 2013-08-19 NOTE — Telephone Encounter (Signed)
Last seen and filled 08/20/12 #30. Please advise    KP

## 2013-08-20 MED ORDER — ZOLPIDEM TARTRATE 5 MG PO TABS: 5.0000 mg | ORAL_TABLET | Freq: Every evening | ORAL | Status: DC | PRN

## 2013-08-20 NOTE — Addendum Note (Signed)
Addended by: Ewing Schlein on: 08/20/2013 08:15 AM   Modules accepted: Orders

## 2013-08-20 NOTE — Telephone Encounter (Signed)
Medication faxed      KP 

## 2013-09-11 ENCOUNTER — Encounter: Payer: Self-pay | Admitting: Family Medicine

## 2013-09-11 ENCOUNTER — Ambulatory Visit (INDEPENDENT_AMBULATORY_CARE_PROVIDER_SITE_OTHER): Payer: 59 | Admitting: Family Medicine

## 2013-09-11 VITALS — BP 110/68 | HR 73 | Temp 98.2°F | Wt 120.0 lb

## 2013-09-11 DIAGNOSIS — J45909 Unspecified asthma, uncomplicated: Secondary | ICD-10-CM

## 2013-09-11 DIAGNOSIS — N6459 Other signs and symptoms in breast: Secondary | ICD-10-CM

## 2013-09-11 DIAGNOSIS — D239 Other benign neoplasm of skin, unspecified: Secondary | ICD-10-CM

## 2013-09-11 DIAGNOSIS — D229 Melanocytic nevi, unspecified: Secondary | ICD-10-CM

## 2013-09-11 DIAGNOSIS — G4700 Insomnia, unspecified: Secondary | ICD-10-CM

## 2013-09-11 MED ORDER — ZOLPIDEM TARTRATE 5 MG PO TABS: 5.0000 mg | ORAL_TABLET | Freq: Every evening | ORAL | Status: DC | PRN

## 2013-09-11 MED ORDER — ALBUTEROL SULFATE HFA 108 (90 BASE) MCG/ACT IN AERS: 2.0000 | INHALATION_SPRAY | Freq: Four times a day (QID) | RESPIRATORY_TRACT | Status: DC | PRN

## 2013-09-11 NOTE — Patient Instructions (Signed)

## 2013-09-11 NOTE — Progress Notes (Signed)
Pre visit review using our clinic review tool, if applicable. No additional management support is needed unless otherwise documented below in the visit note. 

## 2013-09-12 NOTE — Progress Notes (Signed)
   Subjective:    Patient ID: Terry Chang, female    DOB: 01/25/1981, 33 y.o.   MRN: [id number]  HPI Pt here c/o r breast thickening and she needs a refill on meds  . Review of Systems    as above Objective:   Physical Exam BP 110/68  Pulse 73  Temp(Src) 98.2 F (36.8 C) (Oral)  Wt 120 lb (54.432 kg)  SpO2 99% General appearance: alert, cooperative, appears stated age and no distress Neck: no adenopathy, supple, symmetrical, trachea midline and thyroid not enlarged, symmetric, no tenderness/mass/nodules Lungs: clear to auscultation bilaterally Breasts: positive findings: +  fibrocystic breast-- pt states it is very different then usual Heart: regular rate and rhythm, S1, S2 normal, no murmur, click, rub or gallop Extremities: extremities normal, atraumatic, no cyanosis or edema         Assessment & Plan:  1. Insomnia Refill meds - zolpidem (AMBIEN) 5 MG tablet; Take 1 tablet (5 mg total) by mouth at bedtime as needed for sleep.  Dispense: 30 tablet; Refill: 0  2. Unspecified asthma(493.90)  - albuterol (VENTOLIN HFA) 108 (90 BASE) MCG/ACT inhaler; Inhale 2 puffs into the lungs every 6 (six) hours as needed for wheezing.  Dispense: 1 Inhaler; Refill: 0  3. Nevus  - Ambulatory referral to Dermatology  4. Breast thickening Diagnostic mammogram - MM Digital Diagnostic Bilat; Future

## 2013-09-24 ENCOUNTER — Other Ambulatory Visit: Payer: Self-pay | Admitting: Family Medicine

## 2013-09-24 ENCOUNTER — Ambulatory Visit
Admission: RE | Admit: 2013-09-24 | Discharge: 2013-09-24 | Disposition: A | Discharge status: Home / Self Care | Payer: 59 | Source: Ambulatory Visit | Attending: Family Medicine | Admitting: Family Medicine

## 2013-09-24 DIAGNOSIS — N6459 Other signs and symptoms in breast: Secondary | ICD-10-CM

## 2013-10-02 ENCOUNTER — Other Ambulatory Visit: Payer: Self-pay | Admitting: Family Medicine

## 2014-07-16 ENCOUNTER — Encounter: Payer: Self-pay | Admitting: Family Medicine

## 2014-07-16 ENCOUNTER — Ambulatory Visit (INDEPENDENT_AMBULATORY_CARE_PROVIDER_SITE_OTHER): Payer: 59 | Admitting: Family Medicine

## 2014-07-16 VITALS — BP 110/74 | HR 72 | Temp 99.0°F | Wt 123.4 lb

## 2014-07-16 DIAGNOSIS — R591 Generalized enlarged lymph nodes: Secondary | ICD-10-CM

## 2014-07-16 DIAGNOSIS — S161XXA Strain of muscle, fascia and tendon at neck level, initial encounter: Secondary | ICD-10-CM

## 2014-07-16 DIAGNOSIS — R599 Enlarged lymph nodes, unspecified: Secondary | ICD-10-CM

## 2014-07-16 LAB — CBC WITH DIFFERENTIAL/PLATELET
Basophils Absolute: 0 10*3/uL (ref 0.0–0.1)
Basophils Relative: 0 % (ref 0–1)
EOS PCT: 3 % (ref 0–5)
Eosinophils Absolute: 0.2 10*3/uL (ref 0.0–0.7)
HEMATOCRIT: 38.1 % (ref 36.0–46.0)
HEMOGLOBIN: 12.9 g/dL (ref 12.0–15.0)
LYMPHS ABS: 2.4 10*3/uL (ref 0.7–4.0)
LYMPHS PCT: 40 % (ref 12–46)
MCH: 31.9 pg (ref 26.0–34.0)
MCHC: 33.9 g/dL (ref 30.0–36.0)
MCV: 94.3 fL (ref 78.0–100.0)
MONO ABS: 0.5 10*3/uL (ref 0.1–1.0)
MPV: 9.5 fL (ref 8.6–12.4)
Monocytes Relative: 8 % (ref 3–12)
NEUTROS PCT: 49 % (ref 43–77)
Neutro Abs: 3 10*3/uL (ref 1.7–7.7)
Platelets: 318 10*3/uL (ref 150–400)
RBC: 4.04 MIL/uL (ref 3.87–5.11)
RDW: 12.2 % (ref 11.5–15.5)
WBC: 6.1 10*3/uL (ref 4.0–10.5)

## 2014-07-16 MED ORDER — CYCLOBENZAPRINE HCL 10 MG PO TABS: 10.0000 mg | ORAL_TABLET | Freq: Three times a day (TID) | ORAL | Status: DC | PRN

## 2014-07-16 MED ORDER — AMOXICILLIN-POT CLAVULANATE 875-125 MG PO TABS: 1.0000 | ORAL_TABLET | Freq: Two times a day (BID) | ORAL | Status: DC

## 2014-07-16 NOTE — Patient Instructions (Signed)

## 2014-07-16 NOTE — Progress Notes (Signed)
Pre visit review using our clinic review tool, if applicable. No additional management support is needed unless otherwise documented below in the visit note. 

## 2014-07-16 NOTE — Progress Notes (Signed)
Subjective:    Patient ID: Terry Chang, female    DOB: [birth year], 34 y.o.   MRN: [id number]  HPI  Patient here for c/o neck pain x 2 weeks but worsened last week.  She also c/o enlarged lymph node 3 months ago and last 2 months she noticed it was getting bigger.     Past Medical History  Diagnosis Date  . Asthma     Review of Systems  Constitutional: Negative for activity change, appetite change, fatigue and unexpected weight change.  HENT: Negative for congestion, hearing loss, postnasal drip, sinus pressure and sneezing.   Respiratory: Negative for cough and shortness of breath.   Cardiovascular: Negative for chest pain and palpitations.  Musculoskeletal: Positive for neck pain and neck stiffness. Negative for back pain and arthralgias.  Hematological: Positive for adenopathy. Does not bruise/bleed easily.  Psychiatric/Behavioral: Negative for behavioral problems and dysphoric mood. The patient is not nervous/anxious.     Current Outpatient Prescriptions on File Prior to Visit  Medication Sig Dispense Refill  . albuterol (VENTOLIN HFA) 108 (90 BASE) MCG/ACT inhaler Inhale 2 puffs into the lungs every 6 (six) hours as needed for wheezing. 1 Inhaler 0  . esomeprazole (NEXIUM) 20 MG capsule Take 20 mg by mouth daily at 12 noon.    . montelukast (SINGULAIR) 10 MG tablet Take one tablet by mouth nightly at bedtime 30 tablet 11  . zolpidem (AMBIEN) 5 MG tablet Take 1 tablet (5 mg total) by mouth at bedtime as needed for sleep. 30 tablet 0   No current facility-administered medications on file prior to visit.       Objective:    Physical Exam  Constitutional: She is oriented to person, place, and time. She appears well-developed and well-nourished. No distress.  HENT:  Right Ear: External ear normal.  Left Ear: External ear normal.  Nose: Nose normal.  Mouth/Throat: Oropharynx is clear and moist.  Eyes: EOM are normal. Pupils are equal, round, and reactive to light.    Neck: Normal range of motion. Neck supple.  Cardiovascular: Normal rate, regular rhythm and normal heart sounds.   No murmur heard. Pulmonary/Chest: Effort normal and breath sounds normal. No respiratory distress. She has no wheezes. She has no rales. She exhibits no tenderness.  Lymphadenopathy:    She has cervical adenopathy.  Neurological: She is alert and oriented to person, place, and time.  Psychiatric: She has a normal mood and affect. Her behavior is normal. Judgment and thought content normal.    BP 110/74 mmHg  Pulse 72  Temp(Src) 99 F (37.2 C) (Oral)  Wt 123 lb 6.4 oz (55.974 kg)  SpO2 98% Wt Readings from Last 3 Encounters:  07/16/14 123 lb 6.4 oz (55.974 kg)  09/11/13 120 lb (54.432 kg)  08/20/12 127 lb 3.2 oz (57.698 kg)     Lab Results  Component Value Date   WBC 6.1 07/16/2014   HGB 12.9 07/16/2014   HCT 38.1 07/16/2014   PLT 318 07/16/2014   GLUCOSE 84 08/20/2012   CHOL 201* 11/14/2011   TRIG 94.0 11/14/2011   HDL 85.60 11/14/2011   LDLDIRECT 93.5 11/14/2011   ALT 28 08/20/2012   AST 28 08/20/2012   NA 137 08/20/2012   K 4.3 08/20/2012   CL 103 08/20/2012   CREATININE 0.8 08/20/2012   BUN 12 08/20/2012   CO2 26 08/20/2012   TSH 0.95 08/20/2012       Assessment & Plan:   Problem List Items Addressed This  Visit    None    Visit Diagnoses    Lymphadenopathy of head and neck    -  Primary    Relevant Medications    amoxicillin-clavulanate (AUGMENTIN) 875-125 MG per tablet    Other Relevant Orders    CBC with Differential/Platelet (Completed)    Cervical strain, initial encounter        Relevant Medications    cyclobenzaprine (FLEXERIL) tablet       I am having Terry Chang start on cyclobenzaprine. I am also having her maintain her esomeprazole, zolpidem, albuterol, montelukast, and amoxicillin-clavulanate.  Meds ordered this encounter  Medications  . DISCONTD: amoxicillin-clavulanate (AUGMENTIN) 875-125 MG per tablet    Sig: Take 1  tablet by mouth 2 (two) times daily.    Dispense:  20 tablet    Refill:  0  . cyclobenzaprine (FLEXERIL) 10 MG tablet    Sig: Take 1 tablet (10 mg total) by mouth 3 (three) times daily as needed for muscle spasms.    Dispense:  30 tablet    Refill:  0  . amoxicillin-clavulanate (AUGMENTIN) 875-125 MG per tablet    Sig: Take 1 tablet by mouth 2 (two) times daily.    Dispense:  20 tablet    Refill:  0     Garnet Koyanagi, DO

## 2014-07-30 ENCOUNTER — Ambulatory Visit: Payer: 59 | Admitting: Family Medicine

## 2014-08-30 ENCOUNTER — Telehealth: Payer: Self-pay | Admitting: Family Medicine

## 2014-08-30 NOTE — Telephone Encounter (Signed)
Pre Visit letter sent  °

## 2014-09-17 ENCOUNTER — Encounter: Payer: 59 | Admitting: Family Medicine

## 2014-10-13 ENCOUNTER — Telehealth: Payer: Self-pay | Admitting: Family Medicine

## 2014-10-13 NOTE — Telephone Encounter (Signed)
Pre Visit letter sent  °

## 2014-11-02 ENCOUNTER — Encounter: Payer: Self-pay | Admitting: Family Medicine

## 2014-11-02 ENCOUNTER — Other Ambulatory Visit (HOSPITAL_COMMUNITY)
Admission: RE | Admit: 2014-11-02 | Discharge: 2014-11-02 | Disposition: A | Discharge status: Home / Self Care | Payer: 59 | Source: Ambulatory Visit | Attending: Family Medicine | Admitting: Family Medicine

## 2014-11-02 ENCOUNTER — Ambulatory Visit (INDEPENDENT_AMBULATORY_CARE_PROVIDER_SITE_OTHER): Payer: 59 | Admitting: Family Medicine

## 2014-11-02 VITALS — BP 98/60 | HR 74 | Temp 98.3°F | Resp 16 | Ht 62.0 in | Wt 124.0 lb

## 2014-11-02 DIAGNOSIS — Z01419 Encounter for gynecological examination (general) (routine) without abnormal findings: Secondary | ICD-10-CM | POA: Diagnosis present

## 2014-11-02 DIAGNOSIS — Z124 Encounter for screening for malignant neoplasm of cervix: Secondary | ICD-10-CM | POA: Diagnosis not present

## 2014-11-02 DIAGNOSIS — Z0000 Encounter for general adult medical examination without abnormal findings: Secondary | ICD-10-CM

## 2014-11-02 DIAGNOSIS — J45909 Unspecified asthma, uncomplicated: Secondary | ICD-10-CM

## 2014-11-02 LAB — POCT URINALYSIS DIPSTICK
Bilirubin, UA: NEGATIVE
Blood, UA: NEGATIVE
Glucose, UA: NEGATIVE
Ketones, UA: NEGATIVE
LEUKOCYTES UA: NEGATIVE
NITRITE UA: NEGATIVE
PH UA: 6
PROTEIN UA: NEGATIVE
Spec Grav, UA: 1.02
UROBILINOGEN UA: 0.2

## 2014-11-02 LAB — BASIC METABOLIC PANEL
BUN: 10 mg/dL (ref 6–23)
CHLORIDE: 101 meq/L (ref 96–112)
CO2: 27 mEq/L (ref 19–32)
Calcium: 9.2 mg/dL (ref 8.4–10.5)
Creatinine, Ser: 0.77 mg/dL (ref 0.40–1.20)
GFR: 90.19 mL/min (ref >60.00)
Glucose, Bld: 71 mg/dL (ref 70–99)
POTASSIUM: 3.8 meq/L (ref 3.5–5.1)
SODIUM: 134 meq/L — AB (ref 135–145)

## 2014-11-02 LAB — CBC WITH DIFFERENTIAL/PLATELET
BASOS ABS: 0 10*3/uL (ref 0.0–0.1)
Basophils Relative: 0.3 % (ref 0.0–3.0)
Eosinophils Absolute: 0.2 10*3/uL (ref 0.0–0.7)
Eosinophils Relative: 3.3 % (ref 0.0–5.0)
HCT: 35.9 % — ABNORMAL LOW (ref 36.0–46.0)
HEMOGLOBIN: 12 g/dL (ref 12.0–15.0)
Lymphocytes Relative: 39.3 % (ref 12.0–46.0)
Lymphs Abs: 1.8 10*3/uL (ref 0.7–4.0)
MCHC: 33.6 g/dL (ref 30.0–36.0)
MCV: 94.4 fl (ref 78.0–100.0)
MONOS PCT: 4.8 % (ref 3.0–12.0)
Monocytes Absolute: 0.2 10*3/uL (ref 0.1–1.0)
Neutro Abs: 2.4 10*3/uL (ref 1.4–7.7)
Neutrophils Relative %: 52.3 % (ref 43.0–77.0)
PLATELETS: 299 10*3/uL (ref 150.0–400.0)
RBC: 3.8 Mil/uL — ABNORMAL LOW (ref 3.87–5.11)
RDW: 12.6 % (ref 11.5–15.5)
WBC: 4.6 10*3/uL (ref 4.0–10.5)

## 2014-11-02 LAB — LIPID PANEL
Cholesterol: 144 mg/dL (ref 0–200)
HDL: 76.7 mg/dL (ref >39.00)
LDL Cholesterol: 54 mg/dL (ref 0–99)
NONHDL: 67.3
Total CHOL/HDL Ratio: 2
Triglycerides: 69 mg/dL (ref 0.0–149.0)
VLDL: 13.8 mg/dL (ref 0.0–40.0)

## 2014-11-02 LAB — HEPATIC FUNCTION PANEL
ALT: 20 U/L (ref 0–35)
AST: 23 U/L (ref 0–37)
Albumin: 4.2 g/dL (ref 3.5–5.2)
Alkaline Phosphatase: 43 U/L (ref 39–117)
BILIRUBIN TOTAL: 1.1 mg/dL (ref 0.2–1.2)
Bilirubin, Direct: 0.2 mg/dL (ref 0.0–0.3)
Total Protein: 6.8 g/dL (ref 6.0–8.3)

## 2014-11-02 LAB — TSH: TSH: 1.14 u[IU]/mL (ref 0.35–4.50)

## 2014-11-02 MED ORDER — MONTELUKAST SODIUM 10 MG PO TABS: 10.0000 mg | ORAL_TABLET | Freq: Every day | ORAL | Status: DC

## 2014-11-02 MED ORDER — ALBUTEROL SULFATE HFA 108 (90 BASE) MCG/ACT IN AERS: 2.0000 | INHALATION_SPRAY | Freq: Four times a day (QID) | RESPIRATORY_TRACT | Status: DC | PRN

## 2014-11-02 NOTE — Patient Instructions (Signed)
Preventive Care for Adults A healthy lifestyle and preventive care can promote health and wellness. Preventive health guidelines for women include the following key practices.  A routine yearly physical is a good way to check with your health care provider about your health and preventive screening. It is a chance to share any concerns and updates on your health and to receive a thorough exam.  Visit your dentist for a routine exam and preventive care every 6 months. Brush your teeth twice a day and floss once a day. Good oral hygiene prevents tooth decay and gum disease.  The frequency of eye exams is based on your age, health, family medical history, use of contact lenses, and other factors. Follow your health care provider's recommendations for frequency of eye exams.  Eat a healthy diet. Foods like vegetables, fruits, whole grains, low-fat dairy products, and lean protein foods contain the nutrients you need without too many calories. Decrease your intake of foods high in solid fats, added sugars, and salt. Eat the right amount of calories for you.Get information about a proper diet from your health care provider, if necessary.  Regular physical exercise is one of the most important things you can do for your health. Most adults should get at least 150 minutes of moderate-intensity exercise (any activity that increases your heart rate and causes you to sweat) each week. In addition, most adults need muscle-strengthening exercises on 2 or more days a week.  Maintain a healthy weight. The body mass index (BMI) is a screening tool to identify possible weight problems. It provides an estimate of body fat based on height and weight. Your health care provider can find your BMI and can help you achieve or maintain a healthy weight.For adults 20 years and older:  A BMI below 18.5 is considered underweight.  A BMI of 18.5 to 24.9 is normal.  A BMI of 25 to 29.9 is considered overweight.  A BMI of  30 and above is considered obese.  Maintain normal blood lipids and cholesterol levels by exercising and minimizing your intake of saturated fat. Eat a balanced diet with plenty of fruit and vegetables. Blood tests for lipids and cholesterol should begin at age 76 and be repeated every 5 years. If your lipid or cholesterol levels are high, you are over 50, or you are at high risk for heart disease, you may need your cholesterol levels checked more frequently.Ongoing high lipid and cholesterol levels should be treated with medicines if diet and exercise are not working.  If you smoke, find out from your health care provider how to quit. If you do not use tobacco, do not start.  Lung cancer screening is recommended for adults aged 22-80 years who are at high risk for developing lung cancer because of a history of smoking. A yearly low-dose CT scan of the lungs is recommended for people who have at least a 30-pack-year history of smoking and are a current smoker or have quit within the past 15 years. A pack year of smoking is smoking an average of 1 pack of cigarettes a day for 1 year (for example: 1 pack a day for 30 years or 2 packs a day for 15 years). Yearly screening should continue until the smoker has stopped smoking for at least 15 years. Yearly screening should be stopped for people who develop a health problem that would prevent them from having lung cancer treatment.  If you are pregnant, do not drink alcohol. If you are breastfeeding,  be very cautious about drinking alcohol. If you are not pregnant and choose to drink alcohol, do not have more than 1 drink per day. One drink is considered to be 12 ounces (355 mL) of beer, 5 ounces (148 mL) of wine, or 1.5 ounces (44 mL) of liquor.  Avoid use of street drugs. Do not share needles with anyone. Ask for help if you need support or instructions about stopping the use of drugs.  High blood pressure causes heart disease and increases the risk of  stroke. Your blood pressure should be checked at least every 1 to 2 years. Ongoing high blood pressure should be treated with medicines if weight loss and exercise do not work.  If you are 75-52 years old, ask your health care provider if you should take aspirin to prevent strokes.  Diabetes screening involves taking a blood sample to check your fasting blood sugar level. This should be done once every 3 years, after age 15, if you are within normal weight and without risk factors for diabetes. Testing should be considered at a younger age or be carried out more frequently if you are overweight and have at least 1 risk factor for diabetes.  Breast cancer screening is essential preventive care for women. You should practice "breast self-awareness." This means understanding the normal appearance and feel of your breasts and may include breast self-examination. Any changes detected, no matter how small, should be reported to a health care provider. Women in their 58s and 30s should have a clinical breast exam (CBE) by a health care provider as part of a regular health exam every 1 to 3 years. After age 16, women should have a CBE every year. Starting at age 53, women should consider having a mammogram (breast X-ray test) every year. Women who have a family history of breast cancer should talk to their health care provider about genetic screening. Women at a high risk of breast cancer should talk to their health care providers about having an MRI and a mammogram every year.  Breast cancer gene (BRCA)-related cancer risk assessment is recommended for women who have family members with BRCA-related cancers. BRCA-related cancers include breast, ovarian, tubal, and peritoneal cancers. Having family members with these cancers may be associated with an increased risk for harmful changes (mutations) in the breast cancer genes BRCA1 and BRCA2. Results of the assessment will determine the need for genetic counseling and  BRCA1 and BRCA2 testing.  Routine pelvic exams to screen for cancer are no longer recommended for nonpregnant women who are considered low risk for cancer of the pelvic organs (ovaries, uterus, and vagina) and who do not have symptoms. Ask your health care provider if a screening pelvic exam is right for you.  If you have had past treatment for cervical cancer or a condition that could lead to cancer, you need Pap tests and screening for cancer for at least 20 years after your treatment. If Pap tests have been discontinued, your risk factors (such as having a new sexual partner) need to be reassessed to determine if screening should be resumed. Some women have medical problems that increase the chance of getting cervical cancer. In these cases, your health care provider may recommend more frequent screening and Pap tests.  The HPV test is an additional test that may be used for cervical cancer screening. The HPV test looks for the virus that can cause the cell changes on the cervix. The cells collected during the Pap test can be  tested for HPV. The HPV test could be used to screen women aged 30 years and older, and should be used in women of any age who have unclear Pap test results. After the age of 30, women should have HPV testing at the same frequency as a Pap test.  Colorectal cancer can be detected and often prevented. Most routine colorectal cancer screening begins at the age of 50 years and continues through age 75 years. However, your health care provider may recommend screening at an earlier age if you have risk factors for colon cancer. On a yearly basis, your health care provider may provide home test kits to check for hidden blood in the stool. Use of a small camera at the end of a tube, to directly examine the colon (sigmoidoscopy or colonoscopy), can detect the earliest forms of colorectal cancer. Talk to your health care provider about this at age 50, when routine screening begins. Direct  exam of the colon should be repeated every 5-10 years through age 75 years, unless early forms of pre-cancerous polyps or small growths are found.  People who are at an increased risk for hepatitis B should be screened for this virus. You are considered at high risk for hepatitis B if:  You were born in a country where hepatitis B occurs often. Talk with your health care provider about which countries are considered high risk.  Your parents were born in a high-risk country and you have not received a shot to protect against hepatitis B (hepatitis B vaccine).  You have HIV or AIDS.  You use needles to inject street drugs.  You live with, or have sex with, someone who has hepatitis B.  You get hemodialysis treatment.  You take certain medicines for conditions like cancer, organ transplantation, and autoimmune conditions.  Hepatitis C blood testing is recommended for all people born from 1945 through 1965 and any individual with known risks for hepatitis C.  Practice safe sex. Use condoms and avoid high-risk sexual practices to reduce the spread of sexually transmitted infections (STIs). STIs include gonorrhea, chlamydia, syphilis, trichomonas, herpes, HPV, and human immunodeficiency virus (HIV). Herpes, HIV, and HPV are viral illnesses that have no cure. They can result in disability, cancer, and death.  You should be screened for sexually transmitted illnesses (STIs) including gonorrhea and chlamydia if:  You are sexually active and are younger than 24 years.  You are older than 24 years and your health care provider tells you that you are at risk for this type of infection.  Your sexual activity has changed since you were last screened and you are at an increased risk for chlamydia or gonorrhea. Ask your health care provider if you are at risk.  If you are at risk of being infected with HIV, it is recommended that you take a prescription medicine daily to prevent HIV infection. This is  called preexposure prophylaxis (PrEP). You are considered at risk if:  You are a heterosexual woman, are sexually active, and are at increased risk for HIV infection.  You take drugs by injection.  You are sexually active with a partner who has HIV.  Talk with your health care provider about whether you are at high risk of being infected with HIV. If you choose to begin PrEP, you should first be tested for HIV. You should then be tested every 3 months for as long as you are taking PrEP.  Osteoporosis is a disease in which the bones lose minerals and strength   with aging. This can result in serious bone fractures or breaks. The risk of osteoporosis can be identified using a bone density scan. Women ages 65 years and over and women at risk for fractures or osteoporosis should discuss screening with their health care providers. Ask your health care provider whether you should take a calcium supplement or vitamin D to reduce the rate of osteoporosis.  Menopause can be associated with physical symptoms and risks. Hormone replacement therapy is available to decrease symptoms and risks. You should talk to your health care provider about whether hormone replacement therapy is right for you.  Use sunscreen. Apply sunscreen liberally and repeatedly throughout the day. You should seek shade when your shadow is shorter than you. Protect yourself by wearing long sleeves, pants, a wide-brimmed hat, and sunglasses year round, whenever you are outdoors.  Once a month, do a whole body skin exam, using a mirror to look at the skin on your back. Tell your health care provider of new moles, moles that have irregular borders, moles that are larger than a pencil eraser, or moles that have changed in shape or color.  Stay current with required vaccines (immunizations).  Influenza vaccine. All adults should be immunized every year.  Tetanus, diphtheria, and acellular pertussis (Td, Tdap) vaccine. Pregnant women should  receive 1 dose of Tdap vaccine during each pregnancy. The dose should be obtained regardless of the length of time since the last dose. Immunization is preferred during the 27th-36th week of gestation. An adult who has not previously received Tdap or who does not know her vaccine status should receive 1 dose of Tdap. This initial dose should be followed by tetanus and diphtheria toxoids (Td) booster doses every 10 years. Adults with an unknown or incomplete history of completing a 3-dose immunization series with Td-containing vaccines should begin or complete a primary immunization series including a Tdap dose. Adults should receive a Td booster every 10 years.  Varicella vaccine. An adult without evidence of immunity to varicella should receive 2 doses or a second dose if she has previously received 1 dose. Pregnant females who do not have evidence of immunity should receive the first dose after pregnancy. This first dose should be obtained before leaving the health care facility. The second dose should be obtained 4-8 weeks after the first dose.  Human papillomavirus (HPV) vaccine. Females aged 13-26 years who have not received the vaccine previously should obtain the 3-dose series. The vaccine is not recommended for use in pregnant females. However, pregnancy testing is not needed before receiving a dose. If a female is found to be pregnant after receiving a dose, no treatment is needed. In that case, the remaining doses should be delayed until after the pregnancy. Immunization is recommended for any person with an immunocompromised condition through the age of 26 years if she did not get any or all doses earlier. During the 3-dose series, the second dose should be obtained 4-8 weeks after the first dose. The third dose should be obtained 24 weeks after the first dose and 16 weeks after the second dose.  Zoster vaccine. One dose is recommended for adults aged 60 years or older unless certain conditions are  present.  Measles, mumps, and rubella (MMR) vaccine. Adults born before 1957 generally are considered immune to measles and mumps. Adults born in 1957 or later should have 1 or more doses of MMR vaccine unless there is a contraindication to the vaccine or there is laboratory evidence of immunity to   each of the three diseases. A routine second dose of MMR vaccine should be obtained at least 28 days after the first dose for students attending postsecondary schools, health care workers, or international travelers. People who received inactivated measles vaccine or an unknown type of measles vaccine during 1963-1967 should receive 2 doses of MMR vaccine. People who received inactivated mumps vaccine or an unknown type of mumps vaccine before 1979 and are at high risk for mumps infection should consider immunization with 2 doses of MMR vaccine. For females of childbearing age, rubella immunity should be determined. If there is no evidence of immunity, females who are not pregnant should be vaccinated. If there is no evidence of immunity, females who are pregnant should delay immunization until after pregnancy. Unvaccinated health care workers born before 1957 who lack laboratory evidence of measles, mumps, or rubella immunity or laboratory confirmation of disease should consider measles and mumps immunization with 2 doses of MMR vaccine or rubella immunization with 1 dose of MMR vaccine.  Pneumococcal 13-valent conjugate (PCV13) vaccine. When indicated, a person who is uncertain of her immunization history and has no record of immunization should receive the PCV13 vaccine. An adult aged 19 years or older who has certain medical conditions and has not been previously immunized should receive 1 dose of PCV13 vaccine. This PCV13 should be followed with a dose of pneumococcal polysaccharide (PPSV23) vaccine. The PPSV23 vaccine dose should be obtained at least 8 weeks after the dose of PCV13 vaccine. An adult aged 19  years or older who has certain medical conditions and previously received 1 or more doses of PPSV23 vaccine should receive 1 dose of PCV13. The PCV13 vaccine dose should be obtained 1 or more years after the last PPSV23 vaccine dose.  Pneumococcal polysaccharide (PPSV23) vaccine. When PCV13 is also indicated, PCV13 should be obtained first. All adults aged 65 years and older should be immunized. An adult younger than age 65 years who has certain medical conditions should be immunized. Any person who resides in a nursing home or long-term care facility should be immunized. An adult smoker should be immunized. People with an immunocompromised condition and certain other conditions should receive both PCV13 and PPSV23 vaccines. People with human immunodeficiency virus (HIV) infection should be immunized as soon as possible after diagnosis. Immunization during chemotherapy or radiation therapy should be avoided. Routine use of PPSV23 vaccine is not recommended for American Indians, [place] Natives, or people younger than 65 years unless there are medical conditions that require PPSV23 vaccine. When indicated, people who have unknown immunization and have no record of immunization should receive PPSV23 vaccine. One-time revaccination 5 years after the first dose of PPSV23 is recommended for people aged 19-64 years who have chronic kidney failure, nephrotic syndrome, asplenia, or immunocompromised conditions. People who received 1-2 doses of PPSV23 before age 65 years should receive another dose of PPSV23 vaccine at age 65 years or later if at least 5 years have passed since the previous dose. Doses of PPSV23 are not needed for people immunized with PPSV23 at or after age 65 years.  Meningococcal vaccine. Adults with asplenia or persistent complement component deficiencies should receive 2 doses of quadrivalent meningococcal conjugate (MenACWY-D) vaccine. The doses should be obtained at least 2 months apart.  Microbiologists working with certain meningococcal bacteria, military recruits, people at risk during an outbreak, and people who travel to or live in countries with a high rate of meningitis should be immunized. A first-year college student up through age   21 years who is living in a residence hall should receive a dose if she did not receive a dose on or after her 16th birthday. Adults who have certain high-risk conditions should receive one or more doses of vaccine.  Hepatitis A vaccine. Adults who wish to be protected from this disease, have certain high-risk conditions, work with hepatitis A-infected animals, work in hepatitis A research labs, or travel to or work in countries with a high rate of hepatitis A should be immunized. Adults who were previously unvaccinated and who anticipate close contact with an international adoptee during the first 60 days after arrival in the [place] from a country with a high rate of hepatitis A should be immunized.  Hepatitis B vaccine. Adults who wish to be protected from this disease, have certain high-risk conditions, may be exposed to blood or other infectious body fluids, are household contacts or sex partners of hepatitis B positive people, are clients or workers in certain care facilities, or travel to or work in countries with a high rate of hepatitis B should be immunized.  Haemophilus influenzae type b (Hib) vaccine. A previously unvaccinated person with asplenia or sickle cell disease or having a scheduled splenectomy should receive 1 dose of Hib vaccine. Regardless of previous immunization, a recipient of a hematopoietic stem cell transplant should receive a 3-dose series 6-12 months after her successful transplant. Hib vaccine is not recommended for adults with HIV infection. Preventive Services / Frequency Ages 64 to 68 years  Blood pressure check.** / Every 1 to 2 years.  Lipid and cholesterol check.** / Every 5 years beginning at age  22.  Clinical breast exam.** / Every 3 years for women in their 88s and 53s.  BRCA-related cancer risk assessment.** / For women who have family members with a BRCA-related cancer (breast, ovarian, tubal, or peritoneal cancers).  Pap test.** / Every 2 years from ages 90 through 51. Every 3 years starting at age 21 through age 56 or 3 with a history of 3 consecutive normal Pap tests.  HPV screening.** / Every 3 years from ages 24 through ages 1 to 46 with a history of 3 consecutive normal Pap tests.  Hepatitis C blood test.** / For any individual with known risks for hepatitis C.  Skin self-exam. / Monthly.  Influenza vaccine. / Every year.  Tetanus, diphtheria, and acellular pertussis (Tdap, Td) vaccine.** / Consult your health care provider. Pregnant women should receive 1 dose of Tdap vaccine during each pregnancy. 1 dose of Td every 10 years.  Varicella vaccine.** / Consult your health care provider. Pregnant females who do not have evidence of immunity should receive the first dose after pregnancy.  HPV vaccine. / 3 doses over 6 months, if 72 and younger. The vaccine is not recommended for use in pregnant females. However, pregnancy testing is not needed before receiving a dose.  Measles, mumps, rubella (MMR) vaccine.** / You need at least 1 dose of MMR if you were born in 1957 or later. You may also need a 2nd dose. For females of childbearing age, rubella immunity should be determined. If there is no evidence of immunity, females who are not pregnant should be vaccinated. If there is no evidence of immunity, females who are pregnant should delay immunization until after pregnancy.  Pneumococcal 13-valent conjugate (PCV13) vaccine.** / Consult your health care provider.  Pneumococcal polysaccharide (PPSV23) vaccine.** / 1 to 2 doses if you smoke cigarettes or if you have certain conditions.  Meningococcal vaccine.** /  1 dose if you are age 19 to 21 years and a first-year college  student living in a residence hall, or have one of several medical conditions, you need to get vaccinated against meningococcal disease. You may also need additional booster doses.  Hepatitis A vaccine.** / Consult your health care provider.  Hepatitis B vaccine.** / Consult your health care provider.  Haemophilus influenzae type b (Hib) vaccine.** / Consult your health care provider. Ages 40 to 64 years  Blood pressure check.** / Every 1 to 2 years.  Lipid and cholesterol check.** / Every 5 years beginning at age 20 years.  Lung cancer screening. / Every year if you are aged 55-80 years and have a 30-pack-year history of smoking and currently smoke or have quit within the past 15 years. Yearly screening is stopped once you have quit smoking for at least 15 years or develop a health problem that would prevent you from having lung cancer treatment.  Clinical breast exam.** / Every year after age 40 years.  BRCA-related cancer risk assessment.** / For women who have family members with a BRCA-related cancer (breast, ovarian, tubal, or peritoneal cancers).  Mammogram.** / Every year beginning at age 40 years and continuing for as long as you are in good health. Consult with your health care provider.  Pap test.** / Every 3 years starting at age 30 years through age 65 or 70 years with a history of 3 consecutive normal Pap tests.  HPV screening.** / Every 3 years from ages 30 years through ages 65 to 70 years with a history of 3 consecutive normal Pap tests.  Fecal occult blood test (FOBT) of stool. / Every year beginning at age 50 years and continuing until age 75 years. You may not need to do this test if you get a colonoscopy every 10 years.  Flexible sigmoidoscopy or colonoscopy.** / Every 5 years for a flexible sigmoidoscopy or every 10 years for a colonoscopy beginning at age 50 years and continuing until age 75 years.  Hepatitis C blood test.** / For all people born from 1945 through  1965 and any individual with known risks for hepatitis C.  Skin self-exam. / Monthly.  Influenza vaccine. / Every year.  Tetanus, diphtheria, and acellular pertussis (Tdap/Td) vaccine.** / Consult your health care provider. Pregnant women should receive 1 dose of Tdap vaccine during each pregnancy. 1 dose of Td every 10 years.  Varicella vaccine.** / Consult your health care provider. Pregnant females who do not have evidence of immunity should receive the first dose after pregnancy.  Zoster vaccine.** / 1 dose for adults aged 60 years or older.  Measles, mumps, rubella (MMR) vaccine.** / You need at least 1 dose of MMR if you were born in 1957 or later. You may also need a 2nd dose. For females of childbearing age, rubella immunity should be determined. If there is no evidence of immunity, females who are not pregnant should be vaccinated. If there is no evidence of immunity, females who are pregnant should delay immunization until after pregnancy.  Pneumococcal 13-valent conjugate (PCV13) vaccine.** / Consult your health care provider.  Pneumococcal polysaccharide (PPSV23) vaccine.** / 1 to 2 doses if you smoke cigarettes or if you have certain conditions.  Meningococcal vaccine.** / Consult your health care provider.  Hepatitis A vaccine.** / Consult your health care provider.  Hepatitis B vaccine.** / Consult your health care provider.  Haemophilus influenzae type b (Hib) vaccine.** / Consult your health care provider. Ages 65   years and over  Blood pressure check.** / Every 1 to 2 years.  Lipid and cholesterol check.** / Every 5 years beginning at age 22 years.  Lung cancer screening. / Every year if you are aged 73-80 years and have a 30-pack-year history of smoking and currently smoke or have quit within the past 15 years. Yearly screening is stopped once you have quit smoking for at least 15 years or develop a health problem that would prevent you from having lung cancer  treatment.  Clinical breast exam.** / Every year after age 4 years.  BRCA-related cancer risk assessment.** / For women who have family members with a BRCA-related cancer (breast, ovarian, tubal, or peritoneal cancers).  Mammogram.** / Every year beginning at age 40 years and continuing for as long as you are in good health. Consult with your health care provider.  Pap test.** / Every 3 years starting at age 9 years through age 34 or 91 years with 3 consecutive normal Pap tests. Testing can be stopped between 65 and 70 years with 3 consecutive normal Pap tests and no abnormal Pap or HPV tests in the past 10 years.  HPV screening.** / Every 3 years from ages 57 years through ages 64 or 45 years with a history of 3 consecutive normal Pap tests. Testing can be stopped between 65 and 70 years with 3 consecutive normal Pap tests and no abnormal Pap or HPV tests in the past 10 years.  Fecal occult blood test (FOBT) of stool. / Every year beginning at age 15 years and continuing until age 17 years. You may not need to do this test if you get a colonoscopy every 10 years.  Flexible sigmoidoscopy or colonoscopy.** / Every 5 years for a flexible sigmoidoscopy or every 10 years for a colonoscopy beginning at age 86 years and continuing until age 71 years.  Hepatitis C blood test.** / For all people born from [birth year] through 1967 and any individual with known risks for hepatitis C.  Osteoporosis screening.** / A one-time screening for women ages 83 years and over and women at risk for fractures or osteoporosis.  Skin self-exam. / Monthly.  Influenza vaccine. / Every year.  Tetanus, diphtheria, and acellular pertussis (Tdap/Td) vaccine.** / 1 dose of Td every 10 years.  Varicella vaccine.** / Consult your health care provider.  Zoster vaccine.** / 1 dose for adults aged 61 years or older.  Pneumococcal 13-valent conjugate (PCV13) vaccine.** / Consult your health care provider.  Pneumococcal  polysaccharide (PPSV23) vaccine.** / 1 dose for all adults aged 28 years and older.  Meningococcal vaccine.** / Consult your health care provider.  Hepatitis A vaccine.** / Consult your health care provider.  Hepatitis B vaccine.** / Consult your health care provider.  Haemophilus influenzae type b (Hib) vaccine.** / Consult your health care provider. ** Family history and personal history of risk and conditions may change your health care provider's recommendations. Document Released: 06/26/2001 Document Revised: 09/14/2013 Document Reviewed: 09/25/2010 [facility] Patient Information 2015 [place], [place]. This information is not intended to replace advice given to you by your health care provider. Make sure you discuss any questions you have with your health care provider.

## 2014-11-02 NOTE — Progress Notes (Signed)
Pre visit review using our clinic review tool, if applicable. No additional management support is needed unless otherwise documented below in the visit note. 

## 2014-11-02 NOTE — Progress Notes (Signed)
Subjective:     Terry Chang is a 34 y.o. female and is here for a comprehensive physical exam. The patient reports no problems.  History   Social History  . Marital Status: Divorced    Spouse Name: N/A  . Number of Children: 2  . Years of Education: N/A   Occupational History  . [employer]   Social History Main Topics  . Smoking status: Former Smoker -- 0.20 packs/day for 7 years    Quit date: 11/01/2000  . Smokeless tobacco: Never Used  . Alcohol Use: Yes     Comment: OCC  . Drug Use: No  . Sexual Activity:    Partners: Male    Birth Control/ Protection: None   Other Topics Concern  . Not on file   Social History Narrative   Exercise---  Walking and weights   Health Maintenance  Topic Date Due  . HIV Screening  07/16/2015 (Originally 12/02/1993)  . PAP SMEAR  11/14/2014  . INFLUENZA VACCINE  12/13/2014  . TETANUS/TDAP  09/18/2019    The following portions of the patient's history were reviewed and updated as appropriate:  She  has a past medical history of Asthma. She  does not have any pertinent problems on file. She  has no past surgical history on file. Her family history includes Alcohol abuse in her father; Breast cancer in her maternal aunt; Heart attack in her paternal grandfather; Heart disease in her paternal grandfather; Prostate cancer in her maternal grandfather. She  reports that she quit smoking about 14 years ago. She has never used smokeless tobacco. She reports that she drinks alcohol. She reports that she does not use illicit drugs. She has a current medication list which includes the following prescription(s): cyclobenzaprine, esomeprazole, montelukast, zolpidem, and albuterol. Current Outpatient Prescriptions on File Prior to Visit  Medication Sig Dispense Refill  . cyclobenzaprine (FLEXERIL) 10 MG tablet Take 1 tablet (10 mg total) by mouth 3 (three) times daily as needed for muscle spasms. 30 tablet 0  . esomeprazole  (NEXIUM) 20 MG capsule Take 20 mg by mouth daily at 12 noon.    Marland Kitchen zolpidem (AMBIEN) 5 MG tablet Take 1 tablet (5 mg total) by mouth at bedtime as needed for sleep. 30 tablet 0   No current facility-administered medications on file prior to visit.   She has No Known Allergies..  Review of Systems Review of Systems  Constitutional: Negative for activity change, appetite change and fatigue.  HENT: Negative for hearing loss, congestion, tinnitus and ear discharge.  dentist q64m Eyes: Negative for visual disturbance (see optho q1y -- vision corrected to 20/20 with glasses).  Respiratory: Negative for cough, chest tightness and shortness of breath.   Cardiovascular: Negative for chest pain, palpitations and leg swelling.  Gastrointestinal: Negative for abdominal pain, diarrhea, constipation and abdominal distention.  Genitourinary: Negative for urgency, frequency, decreased urine volume and difficulty urinating.  Musculoskeletal: Negative for back pain, arthralgias and gait problem.  Skin: Negative for color change, pallor and rash.  Neurological: Negative for dizziness, light-headedness, numbness and headaches.  Hematological: Negative for adenopathy. Does not bruise/bleed easily.  Psychiatric/Behavioral: Negative for suicidal ideas, confusion, sleep disturbance, self-injury, dysphoric mood, decreased concentration and agitation.       Objective:    BP 98/60 mmHg  Pulse 74  Temp(Src) 98.3 F (36.8 C) (Oral)  Resp 16  Ht 5\' 2"  (1.575 m)  Wt 124 lb (56.246 kg)  BMI 22.67 kg/m2  SpO2 98% General  appearance: alert, cooperative, appears stated age and no distress Head: Normocephalic, without obvious abnormality, atraumatic Eyes: conjunctivae/corneas clear. PERRL, EOM's intact. Fundi benign. Ears: normal TM's and external ear canals both ears Nose: Nares normal. Septum midline. Mucosa normal. No drainage or sinus tenderness. Throat: lips, mucosa, and tongue normal; teeth and gums  normal Neck: no adenopathy, no carotid bruit, no JVD, supple, symmetrical, trachea midline and thyroid not enlarged, symmetric, no tenderness/mass/nodules Back: symmetric, no curvature. ROM normal. No CVA tenderness. Lungs: clear to auscultation bilaterally Breasts: normal appearance, no masses or tenderness Heart: regular rate and rhythm, S1, S2 normal, no murmur, click, rub or gallop Abdomen: soft, non-tender; bowel sounds normal; no masses,  no organomegaly Pelvic: cervix normal in appearance, external genitalia normal, no adnexal masses or tenderness, no cervical motion tenderness, rectovaginal septum normal, uterus normal size, shape, and consistency, vagina normal without discharge and pap done Extremities: extremities normal, atraumatic, no cyanosis or edema Pulses: 2+ and symmetric Skin: Skin color, texture, turgor normal. No rashes or lesions Lymph nodes: Cervical, supraclavicular, and axillary nodes normal. Neurologic: Alert and oriented X 3, normal strength and tone. Normal symmetric reflexes. Normal coordination and gait Psych- no depression, no anxiety      Assessment:    Healthy female exam.      Plan:    ghm utd Check labs See After Visit Summary for Counseling Recommendations

## 2014-11-04 LAB — CYTOLOGY - PAP

## 2015-11-08 ENCOUNTER — Ambulatory Visit (INDEPENDENT_AMBULATORY_CARE_PROVIDER_SITE_OTHER): Payer: 59 | Admitting: Family Medicine

## 2015-11-08 ENCOUNTER — Encounter: Payer: Self-pay | Admitting: Family Medicine

## 2015-11-08 VITALS — BP 106/71 | HR 61 | Temp 98.4°F | Ht 62.25 in | Wt 117.4 lb

## 2015-11-08 DIAGNOSIS — J45909 Unspecified asthma, uncomplicated: Secondary | ICD-10-CM | POA: Diagnosis not present

## 2015-11-08 DIAGNOSIS — Z0000 Encounter for general adult medical examination without abnormal findings: Secondary | ICD-10-CM

## 2015-11-08 LAB — POCT URINALYSIS DIPSTICK
Bilirubin, UA: NEGATIVE
Blood, UA: NEGATIVE
Glucose, UA: NEGATIVE
Leukocytes, UA: NEGATIVE
Nitrite, UA: NEGATIVE
Protein, UA: NEGATIVE
Spec Grav, UA: 1.015
Urobilinogen, UA: 0.2
pH, UA: 6

## 2015-11-08 MED ORDER — ALBUTEROL SULFATE HFA 108 (90 BASE) MCG/ACT IN AERS: 2.0000 | INHALATION_SPRAY | Freq: Four times a day (QID) | RESPIRATORY_TRACT | Status: DC | PRN

## 2015-11-08 MED ORDER — MONTELUKAST SODIUM 10 MG PO TABS: 10.0000 mg | ORAL_TABLET | Freq: Every day | ORAL | Status: DC

## 2015-11-08 NOTE — Progress Notes (Signed)
Subjective:     Terry Chang is a 35 y.o. female and is here for a comprehensive physical exam. The patient reports no problems.  Social History   Social History  . Marital Status: Divorced    Spouse Name: N/A  . Number of Children: 2  . Years of Education: N/A   Occupational History  . [employer]   Social History Main Topics  . Smoking status: Former Smoker -- 0.20 packs/day for 7 years    Quit date: 11/01/2000  . Smokeless tobacco: Never Used  . Alcohol Use: Yes     Comment: OCC  . Drug Use: No  . Sexual Activity:    Partners: Male    Birth Control/ Protection: None   Other Topics Concern  . Not on file   Social History Narrative   Exercise---  Walking and weights   Health Maintenance  Topic Date Due  . INFLUENZA VACCINE  12/13/2015  . PAP SMEAR  11/01/2017  . TETANUS/TDAP  09/18/2019  . HIV Screening  Completed    The following portions of the patient's history were reviewed and updated as appropriate:  She  has a past medical history of Asthma. She  does not have any pertinent problems on file. She  has no past surgical history on file. Her family history includes Alcohol abuse in her father; Breast cancer in her maternal aunt; Heart attack in her paternal grandfather; Heart disease in her paternal grandfather; Prostate cancer in her maternal grandfather. She  reports that she quit smoking about 15 years ago. She has never used smokeless tobacco. She reports that she drinks alcohol. She reports that she does not use illicit drugs. She has a current medication list which includes the following prescription(s): albuterol and montelukast. No current outpatient prescriptions on file prior to visit.   No current facility-administered medications on file prior to visit.   She has No Known Allergies..  Review of Systems Review of Systems  Constitutional: Negative for activity change, appetite change and fatigue.  HENT: Negative for hearing  loss, congestion, tinnitus and ear discharge.  dentist q36m Eyes: Negative for visual disturbance (see optho q1y -- vision corrected to 20/20 with glasses).  Respiratory: Negative for cough, chest tightness and shortness of breath.   Cardiovascular: Negative for chest pain, palpitations and leg swelling.  Gastrointestinal: Negative for abdominal pain, diarrhea, constipation and abdominal distention.  Genitourinary: Negative for urgency, frequency, decreased urine volume and difficulty urinating.  Musculoskeletal: Negative for back pain, arthralgias and gait problem.  Skin: Negative for color change, pallor and rash.  Neurological: Negative for dizziness, light-headedness, numbness and headaches.  Hematological: Negative for adenopathy. Does not bruise/bleed easily.  Psychiatric/Behavioral: Negative for suicidal ideas, confusion, sleep disturbance, self-injury, dysphoric mood, decreased concentration and agitation.       Objective:    BP 106/71 mmHg  Pulse 61  Temp(Src) 98.4 F (36.9 C) (Oral)  Ht 5' 2.25" (1.581 m)  Wt 117 lb 6.4 oz (53.252 kg)  BMI 21.30 kg/m2  SpO2 98% General appearance: alert, cooperative, appears stated age and no distress Head: Normocephalic, without obvious abnormality, atraumatic Eyes: conjunctivae/corneas clear. PERRL, EOM's intact. Fundi benign. Ears: normal TM's and external ear canals both ears Nose: Nares normal. Septum midline. Mucosa normal. No drainage or sinus tenderness. Throat: lips, mucosa, and tongue normal; teeth and gums normal Neck: no adenopathy, no carotid bruit, no JVD, supple, symmetrical, trachea midline and thyroid not enlarged, symmetric, no tenderness/mass/nodules Back: symmetric, no curvature. ROM  normal. No CVA tenderness. Lungs: clear to auscultation bilaterally Breasts: normal appearance, no masses or tenderness Heart: regular rate and rhythm, S1, S2 normal, no murmur, click, rub or gallop Abdomen: soft, non-tender; bowel sounds  normal; no masses,  no organomegaly Pelvic: deferred Extremities: extremities normal, atraumatic, no cyanosis or edema Pulses: 2+ and symmetric Skin: Skin color, texture, turgor normal. No rashes or lesions Lymph nodes: Cervical, supraclavicular, and axillary nodes normal. Neurologic: Alert and oriented X 3, normal strength and tone. Normal symmetric reflexes. Normal coordination and gait    Assessment:    Healthy female exam.      Plan:    ghm utd Check labs See After Visit Summary for Counseling Recommendations

## 2015-11-08 NOTE — Progress Notes (Signed)
Pre visit review using our clinic review tool, if applicable. No additional management support is needed unless otherwise documented below in the visit note. 

## 2015-11-08 NOTE — Patient Instructions (Addendum)
Preventive Care for Adults, Female A healthy lifestyle and preventive care can promote health and wellness. Preventive health guidelines for women include the following key practices.  A routine yearly physical is a good way to check with your health care provider about your health and preventive screening. It is a chance to share any concerns and updates on your health and to receive a thorough exam.  Visit your dentist for a routine exam and preventive care every 6 months. Brush your teeth twice a day and floss once a day. Good oral hygiene prevents tooth decay and gum disease.  The frequency of eye exams is based on your age, health, family medical history, use of contact lenses, and other factors. Follow your health care provider's recommendations for frequency of eye exams.  Eat a healthy diet. Foods like vegetables, fruits, whole grains, low-fat dairy products, and lean protein foods contain the nutrients you need without too many calories. Decrease your intake of foods high in solid fats, added sugars, and salt. Eat the right amount of calories for you.Get information about a proper diet from your health care provider, if necessary.  Regular physical exercise is one of the most important things you can do for your health. Most adults should get at least 150 minutes of moderate-intensity exercise (any activity that increases your heart rate and causes you to sweat) each week. In addition, most adults need muscle-strengthening exercises on 2 or more days a week.  Maintain a healthy weight. The body mass index (BMI) is a screening tool to identify possible weight problems. It provides an estimate of body fat based on height and weight. Your health care provider can find your BMI and can help you achieve or maintain a healthy weight.For adults 20 years and older:  A BMI below 18.5 is considered underweight.  A BMI of 18.5 to 24.9 is normal.  A BMI of 25 to 29.9 is considered overweight.  A  BMI of 30 and above is considered obese.  Maintain normal blood lipids and cholesterol levels by exercising and minimizing your intake of saturated fat. Eat a balanced diet with plenty of fruit and vegetables. Blood tests for lipids and cholesterol should begin at age 45 and be repeated every 5 years. If your lipid or cholesterol levels are high, you are over 50, or you are at high risk for heart disease, you may need your cholesterol levels checked more frequently.Ongoing high lipid and cholesterol levels should be treated with medicines if diet and exercise are not working.  If you smoke, find out from your health care provider how to quit. If you do not use tobacco, do not start.  Lung cancer screening is recommended for adults aged 45-80 years who are at high risk for developing lung cancer because of a history of smoking. A yearly low-dose CT scan of the lungs is recommended for people who have at least a 30-pack-year history of smoking and are a current smoker or have quit within the past 15 years. A pack year of smoking is smoking an average of 1 pack of cigarettes a day for 1 year (for example: 1 pack a day for 30 years or 2 packs a day for 15 years). Yearly screening should continue until the smoker has stopped smoking for at least 15 years. Yearly screening should be stopped for people who develop a health problem that would prevent them from having lung cancer treatment.  If you are pregnant, do not drink alcohol. If you are  breastfeeding, be very cautious about drinking alcohol. If you are not pregnant and choose to drink alcohol, do not have more than 1 drink per day. One drink is considered to be 12 ounces (355 mL) of beer, 5 ounces (148 mL) of wine, or 1.5 ounces (44 mL) of liquor.  Avoid use of street drugs. Do not share needles with anyone. Ask for help if you need support or instructions about stopping the use of drugs.  High blood pressure causes heart disease and increases the risk  of stroke. Your blood pressure should be checked at least every 1 to 2 years. Ongoing high blood pressure should be treated with medicines if weight loss and exercise do not work.  If you are 55-79 years old, ask your health care provider if you should take aspirin to prevent strokes.  Diabetes screening is done by taking a blood sample to check your blood glucose level after you have not eaten for a certain period of time (fasting). If you are not overweight and you do not have risk factors for diabetes, you should be screened once every 3 years starting at age 45. If you are overweight or obese and you are 40-70 years of age, you should be screened for diabetes every year as part of your cardiovascular risk assessment.  Breast cancer screening is essential preventive care for women. You should practice "breast self-awareness." This means understanding the normal appearance and feel of your breasts and may include breast self-examination. Any changes detected, no matter how small, should be reported to a health care provider. Women in their 20s and 30s should have a clinical breast exam (CBE) by a health care provider as part of a regular health exam every 1 to 3 years. After age 40, women should have a CBE every year. Starting at age 40, women should consider having a mammogram (breast X-ray test) every year. Women who have a family history of breast cancer should talk to their health care provider about genetic screening. Women at a high risk of breast cancer should talk to their health care providers about having an MRI and a mammogram every year.  Breast cancer gene (BRCA)-related cancer risk assessment is recommended for women who have family members with BRCA-related cancers. BRCA-related cancers include breast, ovarian, tubal, and peritoneal cancers. Having family members with these cancers may be associated with an increased risk for harmful changes (mutations) in the breast cancer genes BRCA1 and  BRCA2. Results of the assessment will determine the need for genetic counseling and BRCA1 and BRCA2 testing.  Your health care provider may recommend that you be screened regularly for cancer of the pelvic organs (ovaries, uterus, and vagina). This screening involves a pelvic examination, including checking for microscopic changes to the surface of your cervix (Pap test). You may be encouraged to have this screening done every 3 years, beginning at age 21.  For women ages 30-65, health care providers may recommend pelvic exams and Pap testing every 3 years, or they may recommend the Pap and pelvic exam, combined with testing for human papilloma virus (HPV), every 5 years. Some types of HPV increase your risk of cervical cancer. Testing for HPV may also be done on women of any age with unclear Pap test results.  Other health care providers may not recommend any screening for nonpregnant women who are considered low risk for pelvic cancer and who do not have symptoms. Ask your health care provider if a screening pelvic exam is right for   you.  If you have had past treatment for cervical cancer or a condition that could lead to cancer, you need Pap tests and screening for cancer for at least 20 years after your treatment. If Pap tests have been discontinued, your risk factors (such as having a new sexual partner) need to be reassessed to determine if screening should resume. Some women have medical problems that increase the chance of getting cervical cancer. In these cases, your health care provider may recommend more frequent screening and Pap tests.  Colorectal cancer can be detected and often prevented. Most routine colorectal cancer screening begins at the age of 50 years and continues through age 75 years. However, your health care provider may recommend screening at an earlier age if you have risk factors for colon cancer. On a yearly basis, your health care provider may provide home test kits to check  for hidden blood in the stool. Use of a small camera at the end of a tube, to directly examine the colon (sigmoidoscopy or colonoscopy), can detect the earliest forms of colorectal cancer. Talk to your health care provider about this at age 50, when routine screening begins. Direct exam of the colon should be repeated every 5-10 years through age 75 years, unless early forms of precancerous polyps or small growths are found.  People who are at an increased risk for hepatitis B should be screened for this virus. You are considered at high risk for hepatitis B if:  You were born in a country where hepatitis B occurs often. Talk with your health care provider about which countries are considered high risk.  Your parents were born in a high-risk country and you have not received a shot to protect against hepatitis B (hepatitis B vaccine).  You have HIV or AIDS.  You use needles to inject street drugs.  You live with, or have sex with, someone who has hepatitis B.  You get hemodialysis treatment.  You take certain medicines for conditions like cancer, organ transplantation, and autoimmune conditions.  Hepatitis C blood testing is recommended for all people born from 1945 through 1965 and any individual with known risks for hepatitis C.  Practice safe sex. Use condoms and avoid high-risk sexual practices to reduce the spread of sexually transmitted infections (STIs). STIs include gonorrhea, chlamydia, syphilis, trichomonas, herpes, HPV, and human immunodeficiency virus (HIV). Herpes, HIV, and HPV are viral illnesses that have no cure. They can result in disability, cancer, and death.  You should be screened for sexually transmitted illnesses (STIs) including gonorrhea and chlamydia if:  You are sexually active and are younger than 24 years.  You are older than 24 years and your health care provider tells you that you are at risk for this type of infection.  Your sexual activity has changed  since you were last screened and you are at an increased risk for chlamydia or gonorrhea. Ask your health care provider if you are at risk.  If you are at risk of being infected with HIV, it is recommended that you take a prescription medicine daily to prevent HIV infection. This is called preexposure prophylaxis (PrEP). You are considered at risk if:  You are sexually active and do not regularly use condoms or know the HIV status of your partner(s).  You take drugs by injection.  You are sexually active with a partner who has HIV.  Talk with your health care provider about whether you are at high risk of being infected with HIV. If   you choose to begin PrEP, you should first be tested for HIV. You should then be tested every 3 months for as long as you are taking PrEP.  Osteoporosis is a disease in which the bones lose minerals and strength with aging. This can result in serious bone fractures or breaks. The risk of osteoporosis can be identified using a bone density scan. Women ages 67 years and over and women at risk for fractures or osteoporosis should discuss screening with their health care providers. Ask your health care provider whether you should take a calcium supplement or vitamin D to reduce the rate of osteoporosis.  Menopause can be associated with physical symptoms and risks. Hormone replacement therapy is available to decrease symptoms and risks. You should talk to your health care provider about whether hormone replacement therapy is right for you.  Use sunscreen. Apply sunscreen liberally and repeatedly throughout the day. You should seek shade when your shadow is shorter than you. Protect yourself by wearing long sleeves, pants, a wide-brimmed hat, and sunglasses year round, whenever you are outdoors.  Once a month, do a whole body skin exam, using a mirror to look at the skin on your back. Tell your health care provider of new moles, moles that have irregular borders, moles that  are larger than a pencil eraser, or moles that have changed in shape or color.  Stay current with required vaccines (immunizations).  Influenza vaccine. All adults should be immunized every year.  Tetanus, diphtheria, and acellular pertussis (Td, Tdap) vaccine. Pregnant women should receive 1 dose of Tdap vaccine during each pregnancy. The dose should be obtained regardless of the length of time since the last dose. Immunization is preferred during the 27th-36th week of gestation. An adult who has not previously received Tdap or who does not know her vaccine status should receive 1 dose of Tdap. This initial dose should be followed by tetanus and diphtheria toxoids (Td) booster doses every 10 years. Adults with an unknown or incomplete history of completing a 3-dose immunization series with Td-containing vaccines should begin or complete a primary immunization series including a Tdap dose. Adults should receive a Td booster every 10 years.  Varicella vaccine. An adult without evidence of immunity to varicella should receive 2 doses or a second dose if she has previously received 1 dose. Pregnant females who do not have evidence of immunity should receive the first dose after pregnancy. This first dose should be obtained before leaving the health care facility. The second dose should be obtained 4-8 weeks after the first dose.  Human papillomavirus (HPV) vaccine. Females aged 13-26 years who have not received the vaccine previously should obtain the 3-dose series. The vaccine is not recommended for use in pregnant females. However, pregnancy testing is not needed before receiving a dose. If a female is found to be pregnant after receiving a dose, no treatment is needed. In that case, the remaining doses should be delayed until after the pregnancy. Immunization is recommended for any person with an immunocompromised condition through the age of 61 years if she did not get any or all doses earlier. During the  3-dose series, the second dose should be obtained 4-8 weeks after the first dose. The third dose should be obtained 24 weeks after the first dose and 16 weeks after the second dose.  Zoster vaccine. One dose is recommended for adults aged 30 years or older unless certain conditions are present.  Measles, mumps, and rubella (MMR) vaccine. Adults born  before 1957 generally are considered immune to measles and mumps. Adults born in 1957 or later should have 1 or more doses of MMR vaccine unless there is a contraindication to the vaccine or there is laboratory evidence of immunity to each of the three diseases. A routine second dose of MMR vaccine should be obtained at least 28 days after the first dose for students attending postsecondary schools, health care workers, or international travelers. People who received inactivated measles vaccine or an unknown type of measles vaccine during 1963-1967 should receive 2 doses of MMR vaccine. People who received inactivated mumps vaccine or an unknown type of mumps vaccine before 1979 and are at high risk for mumps infection should consider immunization with 2 doses of MMR vaccine. For females of childbearing age, rubella immunity should be determined. If there is no evidence of immunity, females who are not pregnant should be vaccinated. If there is no evidence of immunity, females who are pregnant should delay immunization until after pregnancy. Unvaccinated health care workers born before 1957 who lack laboratory evidence of measles, mumps, or rubella immunity or laboratory confirmation of disease should consider measles and mumps immunization with 2 doses of MMR vaccine or rubella immunization with 1 dose of MMR vaccine.  Pneumococcal 13-valent conjugate (PCV13) vaccine. When indicated, a person who is uncertain of his immunization history and has no record of immunization should receive the PCV13 vaccine. All adults 65 years of age and older should receive this  vaccine. An adult aged 19 years or older who has certain medical conditions and has not been previously immunized should receive 1 dose of PCV13 vaccine. This PCV13 should be followed with a dose of pneumococcal polysaccharide (PPSV23) vaccine. Adults who are at high risk for pneumococcal disease should obtain the PPSV23 vaccine at least 8 weeks after the dose of PCV13 vaccine. Adults older than 37 years of age who have normal immune system function should obtain the PPSV23 vaccine dose at least 1 year after the dose of PCV13 vaccine.  Pneumococcal polysaccharide (PPSV23) vaccine. When PCV13 is also indicated, PCV13 should be obtained first. All adults aged 65 years and older should be immunized. An adult younger than age 65 years who has certain medical conditions should be immunized. Any person who resides in a nursing home or long-term care facility should be immunized. An adult smoker should be immunized. People with an immunocompromised condition and certain other conditions should receive both PCV13 and PPSV23 vaccines. People with human immunodeficiency virus (HIV) infection should be immunized as soon as possible after diagnosis. Immunization during chemotherapy or radiation therapy should be avoided. Routine use of PPSV23 vaccine is not recommended for American Indians, [place] Natives, or people younger than 65 years unless there are medical conditions that require PPSV23 vaccine. When indicated, people who have unknown immunization and have no record of immunization should receive PPSV23 vaccine. One-time revaccination 5 years after the first dose of PPSV23 is recommended for people aged 19-64 years who have chronic kidney failure, nephrotic syndrome, asplenia, or immunocompromised conditions. People who received 1-2 doses of PPSV23 before age 65 years should receive another dose of PPSV23 vaccine at age 65 years or later if at least 5 years have passed since the previous dose. Doses of PPSV23 are not  needed for people immunized with PPSV23 at or after age 65 years.  Meningococcal vaccine. Adults with asplenia or persistent complement component deficiencies should receive 2 doses of quadrivalent meningococcal conjugate (MenACWY-D) vaccine. The doses should be obtained   at least 2 months apart. Microbiologists working with certain meningococcal bacteria, [place] recruits, people at risk during an outbreak, and people who travel to or live in countries with a high rate of meningitis should be immunized. A first-year college student up through age 34 years who is living in a residence hall should receive a dose if she did not receive a dose on or after her 16th birthday. Adults who have certain high-risk conditions should receive one or more doses of vaccine.  Hepatitis A vaccine. Adults who wish to be protected from this disease, have certain high-risk conditions, work with hepatitis A-infected animals, work in hepatitis A research labs, or travel to or work in countries with a high rate of hepatitis A should be immunized. Adults who were previously unvaccinated and who anticipate close contact with an international adoptee during the first 60 days after arrival in the [place] from a country with a high rate of hepatitis A should be immunized.  Hepatitis B vaccine. Adults who wish to be protected from this disease, have certain high-risk conditions, may be exposed to blood or other infectious body fluids, are household contacts or sex partners of hepatitis B positive people, are clients or workers in certain care facilities, or travel to or work in countries with a high rate of hepatitis B should be immunized.  Haemophilus influenzae type b (Hib) vaccine. A previously unvaccinated person with asplenia or sickle cell disease or having a scheduled splenectomy should receive 1 dose of Hib vaccine. Regardless of previous immunization, a recipient of a hematopoietic stem cell transplant should receive a  3-dose series 6-12 months after her successful transplant. Hib vaccine is not recommended for adults with HIV infection. Preventive Services / Frequency Ages 35 to 4 years  Blood pressure check.** / Every 3-5 years.  Lipid and cholesterol check.** / Every 5 years beginning at age 60.  Clinical breast exam.** / Every 3 years for women in their 71s and 10s.  BRCA-related cancer risk assessment.** / For women who have family members with a BRCA-related cancer (breast, ovarian, tubal, or peritoneal cancers).  Pap test.** / Every 2 years from ages 76 through 26. Every 3 years starting at age 61 through age 76 or 93 with a history of 3 consecutive normal Pap tests.  HPV screening.** / Every 3 years from ages 37 through ages 60 to 51 with a history of 3 consecutive normal Pap tests.  Hepatitis C blood test.** / For any individual with known risks for hepatitis C.  Skin self-exam. / Monthly.  Influenza vaccine. / Every year.  Tetanus, diphtheria, and acellular pertussis (Tdap, Td) vaccine.** / Consult your health care provider. Pregnant women should receive 1 dose of Tdap vaccine during each pregnancy. 1 dose of Td every 10 years.  Varicella vaccine.** / Consult your health care provider. Pregnant females who do not have evidence of immunity should receive the first dose after pregnancy.  HPV vaccine. / 3 doses over 6 months, if 93 and younger. The vaccine is not recommended for use in pregnant females. However, pregnancy testing is not needed before receiving a dose.  Measles, mumps, rubella (MMR) vaccine.** / You need at least 1 dose of MMR if you were born in 1957 or later. You may also need a 2nd dose. For females of childbearing age, rubella immunity should be determined. If there is no evidence of immunity, females who are not pregnant should be vaccinated. If there is no evidence of immunity, females who are  pregnant should delay immunization until after pregnancy.  Pneumococcal  13-valent conjugate (PCV13) vaccine.** / Consult your health care provider.  Pneumococcal polysaccharide (PPSV23) vaccine.** / 1 to 2 doses if you smoke cigarettes or if you have certain conditions.  Meningococcal vaccine.** / 1 dose if you are age 68 to 8 years and a Market researcher living in a residence hall, or have one of several medical conditions, you need to get vaccinated against meningococcal disease. You may also need additional booster doses.  Hepatitis A vaccine.** / Consult your health care provider.  Hepatitis B vaccine.** / Consult your health care provider.  Haemophilus influenzae type b (Hib) vaccine.** / Consult your health care provider. Ages 7 to 53 years  Blood pressure check.** / Every year.  Lipid and cholesterol check.** / Every 5 years beginning at age 25 years.  Lung cancer screening. / Every year if you are aged 11-80 years and have a 30-pack-year history of smoking and currently smoke or have quit within the past 15 years. Yearly screening is stopped once you have quit smoking for at least 15 years or develop a health problem that would prevent you from having lung cancer treatment.  Clinical breast exam.** / Every year after age 48 years.  BRCA-related cancer risk assessment.** / For women who have family members with a BRCA-related cancer (breast, ovarian, tubal, or peritoneal cancers).  Mammogram.** / Every year beginning at age 41 years and continuing for as long as you are in good health. Consult with your health care provider.  Pap test.** / Every 3 years starting at age 65 years through age 37 or 70 years with a history of 3 consecutive normal Pap tests.  HPV screening.** / Every 3 years from ages 72 years through ages 60 to 40 years with a history of 3 consecutive normal Pap tests.  Fecal occult blood test (FOBT) of stool. / Every year beginning at age 21 years and continuing until age 5 years. You may not need to do this test if you get  a colonoscopy every 10 years.  Flexible sigmoidoscopy or colonoscopy.** / Every 5 years for a flexible sigmoidoscopy or every 10 years for a colonoscopy beginning at age 35 years and continuing until age 48 years.  Hepatitis C blood test.** / For all people born from [birth year] through 1965 and any individual with known risks for hepatitis C.  Skin self-exam. / Monthly.  Influenza vaccine. / Every year.  Tetanus, diphtheria, and acellular pertussis (Tdap/Td) vaccine.** / Consult your health care provider. Pregnant women should receive 1 dose of Tdap vaccine during each pregnancy. 1 dose of Td every 10 years.  Varicella vaccine.** / Consult your health care provider. Pregnant females who do not have evidence of immunity should receive the first dose after pregnancy.  Zoster vaccine.** / 1 dose for adults aged 30 years or older.  Measles, mumps, rubella (MMR) vaccine.** / You need at least 1 dose of MMR if you were born in 1957 or later. You may also need a second dose. For females of childbearing age, rubella immunity should be determined. If there is no evidence of immunity, females who are not pregnant should be vaccinated. If there is no evidence of immunity, females who are pregnant should delay immunization until after pregnancy.  Pneumococcal 13-valent conjugate (PCV13) vaccine.** / Consult your health care provider.  Pneumococcal polysaccharide (PPSV23) vaccine.** / 1 to 2 doses if you smoke cigarettes or if you have certain conditions.  Meningococcal vaccine.** /  Consult your health care provider.  Hepatitis A vaccine.** / Consult your health care provider.  Hepatitis B vaccine.** / Consult your health care provider.  Haemophilus influenzae type b (Hib) vaccine.** / Consult your health care provider. Ages 83 years and over  Blood pressure check.** / Every year.  Lipid and cholesterol check.** / Every 5 years beginning at age 56 years.  Lung cancer screening. / Every year if you  are aged 49-80 years and have a 30-pack-year history of smoking and currently smoke or have quit within the past 15 years. Yearly screening is stopped once you have quit smoking for at least 15 years or develop a health problem that would prevent you from having lung cancer treatment.  Clinical breast exam.** / Every year after age 69 years.  BRCA-related cancer risk assessment.** / For women who have family members with a BRCA-related cancer (breast, ovarian, tubal, or peritoneal cancers).  Mammogram.** / Every year beginning at age 47 years and continuing for as long as you are in good health. Consult with your health care provider.  Pap test.** / Every 3 years starting at age 42 years through age 24 or 18 years with 3 consecutive normal Pap tests. Testing can be stopped between 65 and 70 years with 3 consecutive normal Pap tests and no abnormal Pap or HPV tests in the past 10 years.  HPV screening.** / Every 3 years from ages 70 years through ages 28 or 73 years with a history of 3 consecutive normal Pap tests. Testing can be stopped between 65 and 70 years with 3 consecutive normal Pap tests and no abnormal Pap or HPV tests in the past 10 years.  Fecal occult blood test (FOBT) of stool. / Every year beginning at age 11 years and continuing until age 7 years. You may not need to do this test if you get a colonoscopy every 10 years.  Flexible sigmoidoscopy or colonoscopy.** / Every 5 years for a flexible sigmoidoscopy or every 10 years for a colonoscopy beginning at age 63 years and continuing until age 5 years.  Hepatitis C blood test.** / For all people born from [birth year] through 1967 and any individual with known risks for hepatitis C.  Osteoporosis screening.** / A one-time screening for women ages 61 years and over and women at risk for fractures or osteoporosis.  Skin self-exam. / Monthly.  Influenza vaccine. / Every year.  Tetanus, diphtheria, and acellular pertussis (Tdap/Td)  vaccine.** / 1 dose of Td every 10 years.  Varicella vaccine.** / Consult your health care provider.  3  Hepatitis A vaccine.** / Consult your health care provider.  Hepatitis B vaccine.** / Consult your health care provider.  Haemophilus influenzae type b (Hib) vaccine.** / Consult your health care provider. ** Family history and personal history of risk and conditions may change your health care provider's recommendations.   This information is not intended to replace advice given to you by your health care provider. Make sure you discuss any questions you have with your health care provider.   Document Released: 06/26/2001 Document Revised: 05/21/2014 Document Reviewed: 09/25/2010 Elsevier Interactive Patient Education [employer].

## 2015-11-09 LAB — CBC WITH DIFFERENTIAL/PLATELET
BASOS PCT: 0.3 % (ref 0.0–3.0)
Basophils Absolute: 0 10*3/uL (ref 0.0–0.1)
Eosinophils Absolute: 0.1 10*3/uL (ref 0.0–0.7)
Eosinophils Relative: 1.1 % (ref 0.0–5.0)
HCT: 39 % (ref 36.0–46.0)
HEMOGLOBIN: 13.1 g/dL (ref 12.0–15.0)
LYMPHS PCT: 27 % (ref 12.0–46.0)
Lymphs Abs: 2.1 10*3/uL (ref 0.7–4.0)
MCHC: 33.7 g/dL (ref 30.0–36.0)
MCV: 91.5 fl (ref 78.0–100.0)
Monocytes Absolute: 0.3 10*3/uL (ref 0.1–1.0)
Monocytes Relative: 4 % (ref 3.0–12.0)
NEUTROS ABS: 5.3 10*3/uL (ref 1.4–7.7)
Neutrophils Relative %: 67.6 % (ref 43.0–77.0)
PLATELETS: 307 10*3/uL (ref 150.0–400.0)
RBC: 4.26 Mil/uL (ref 3.87–5.11)
RDW: 11.9 % (ref 11.5–15.5)
WBC: 7.8 10*3/uL (ref 4.0–10.5)

## 2015-11-09 LAB — COMPREHENSIVE METABOLIC PANEL
ALBUMIN: 4.5 g/dL (ref 3.5–5.2)
ALT: 26 U/L (ref 0–35)
AST: 23 U/L (ref 0–37)
Alkaline Phosphatase: 62 U/L (ref 39–117)
BUN: 10 mg/dL (ref 6–23)
CHLORIDE: 101 meq/L (ref 96–112)
CO2: 27 meq/L (ref 19–32)
CREATININE: 0.77 mg/dL (ref 0.40–1.20)
Calcium: 9.5 mg/dL (ref 8.4–10.5)
GFR: 89.68 mL/min (ref >60.00)
Glucose, Bld: 76 mg/dL (ref 70–99)
POTASSIUM: 3.9 meq/L (ref 3.5–5.1)
Sodium: 135 mEq/L (ref 135–145)
Total Bilirubin: 1.5 mg/dL — ABNORMAL HIGH (ref 0.2–1.2)
Total Protein: 7.3 g/dL (ref 6.0–8.3)

## 2015-11-09 LAB — LIPID PANEL
CHOL/HDL RATIO: 2
Cholesterol: 168 mg/dL (ref 0–200)
HDL: 80.8 mg/dL (ref >39.00)
LDL Cholesterol: 74 mg/dL (ref 0–99)
NONHDL: 87.2
Triglycerides: 66 mg/dL (ref 0.0–149.0)
VLDL: 13.2 mg/dL (ref 0.0–40.0)

## 2015-11-09 LAB — TSH: TSH: 0.12 u[IU]/mL — AB (ref 0.35–4.50)

## 2015-11-16 ENCOUNTER — Other Ambulatory Visit: Payer: Self-pay

## 2015-11-16 DIAGNOSIS — E0590 Thyrotoxicosis, unspecified without thyrotoxic crisis or storm: Secondary | ICD-10-CM

## 2015-11-22 ENCOUNTER — Other Ambulatory Visit (INDEPENDENT_AMBULATORY_CARE_PROVIDER_SITE_OTHER): Payer: 59

## 2015-11-22 DIAGNOSIS — E0590 Thyrotoxicosis, unspecified without thyrotoxic crisis or storm: Secondary | ICD-10-CM

## 2015-11-22 LAB — T4, FREE: FREE T4: 0.74 ng/dL (ref 0.60–1.60)

## 2015-11-22 LAB — T3, FREE: T3 FREE: 2.4 pg/mL (ref 2.3–4.2)

## 2015-11-22 LAB — TSH: TSH: 2.01 u[IU]/mL (ref 0.35–4.50)

## 2015-11-22 NOTE — Addendum Note (Signed)
Addended by: Peggyann Shoals on: 11/22/2015 07:47 AM   Modules accepted: Orders

## 2015-11-22 NOTE — Addendum Note (Signed)
Addended by: Peggyann Shoals on: 11/22/2015 07:33 AM   Modules accepted: Orders

## 2016-04-02 ENCOUNTER — Ambulatory Visit (INDEPENDENT_AMBULATORY_CARE_PROVIDER_SITE_OTHER): Payer: 59 | Admitting: Family Medicine

## 2016-04-02 ENCOUNTER — Encounter: Payer: Self-pay | Admitting: Family Medicine

## 2016-04-02 VITALS — BP 111/72 | HR 81 | Temp 98.0°F | Ht 62.0 in | Wt 128.0 lb

## 2016-04-02 DIAGNOSIS — J4521 Mild intermittent asthma with (acute) exacerbation: Secondary | ICD-10-CM

## 2016-04-02 DIAGNOSIS — J209 Acute bronchitis, unspecified: Secondary | ICD-10-CM | POA: Diagnosis not present

## 2016-04-02 MED ORDER — HYDROCODONE-HOMATROPINE 5-1.5 MG/5ML PO SYRP: 5.0000 mL | ORAL_SOLUTION | Freq: Three times a day (TID) | ORAL | 0 refills | Status: DC | PRN

## 2016-04-02 MED ORDER — AZITHROMYCIN 250 MG PO TABS: ORAL_TABLET | ORAL | 0 refills | Status: DC

## 2016-04-02 MED ORDER — ALBUTEROL SULFATE HFA 108 (90 BASE) MCG/ACT IN AERS: 2.0000 | INHALATION_SPRAY | Freq: Four times a day (QID) | RESPIRATORY_TRACT | 5 refills | Status: DC | PRN

## 2016-04-02 NOTE — Progress Notes (Signed)
Subjective:     Terry Chang is a 35 y.o. female here for evaluation of a cough. Onset of symptoms was 1 week ago. Symptoms have been gradually worsening since that time. The cough is painful and productive and is aggravated by reclining position. Associated symptoms include: chills, postnasal drip, shortness of breath, sputum production and wheezing. Patient does have a history of asthma. Patient does have a history of environmental allergens. Patient has not traveled recently. Patient does not have a history of smoking. Patient has not had a previous chest x-ray. Patient has not had a PPD done.  The following portions of the patient's history were reviewed and updated as appropriate: She  has a past medical history of Asthma. She  does not have any pertinent problems on file. She  has no past surgical history on file. Her family history includes Alcohol abuse in her father; Breast cancer in her maternal aunt; Heart attack in her paternal grandfather; Heart disease in her paternal grandfather; Prostate cancer in her maternal grandfather. She  reports that she quit smoking about 15 years ago. She has a 1.40 pack-year smoking history. She has never used smokeless tobacco. She reports that she drinks alcohol. She reports that she does not use drugs. She has a current medication list which includes the following prescription(s): albuterol and montelukast. Current Outpatient Prescriptions on File Prior to Visit  Medication Sig Dispense Refill  . albuterol (VENTOLIN HFA) 108 (90 Base) MCG/ACT inhaler Inhale 2 puffs into the lungs every 6 (six) hours as needed for wheezing. 1 Inhaler 5  . montelukast (SINGULAIR) 10 MG tablet Take 1 tablet (10 mg total) by mouth at bedtime. 90 tablet 3   No current facility-administered medications on file prior to visit.    She has No Known Allergies..  Review of Systems Pertinent items are noted in HPI.    Objective:    Oxygen saturation 100% on room air BP  111/72   Pulse 81   Temp 98 F (36.7 C) (Oral)   Ht 5\' 2"  (1.575 m)   Wt 128 lb (58.1 kg)   SpO2 100%   BMI 23.41 kg/m  General appearance: alert, cooperative, appears stated age and no distress Ears: normal TM's and external ear canals both ears Nose: green discharge, moderate congestion, turbinates red, swollen, sinus tenderness no Throat: abnormal findings: marked oropharyngeal erythema Neck: mild anterior cervical adenopathy, supple, symmetrical, trachea midline and thyroid not enlarged, symmetric, no tenderness/mass/nodules Lungs: clear to auscultation bilaterally Heart: S1, S2 normal    Assessment:    Acute Bronchitis    Plan:    Antibiotics per medication orders. Antitussives per medication orders. Avoid exposure to tobacco smoke and fumes. B-agonist inhaler. Call if shortness of breath worsens, blood in sputum, change in character of cough, development of fever or chills, inability to maintain nutrition and hydration. Avoid exposure to tobacco smoke and fumes.

## 2016-04-02 NOTE — Progress Notes (Signed)
Pre visit review using our clinic tool,if applicable. No additional management support is needed unless otherwise documented below in the visit note.  

## 2016-04-02 NOTE — Patient Instructions (Signed)

## 2016-08-20 ENCOUNTER — Encounter: Payer: Self-pay | Admitting: Family Medicine

## 2016-08-20 ENCOUNTER — Ambulatory Visit (INDEPENDENT_AMBULATORY_CARE_PROVIDER_SITE_OTHER): Payer: 59 | Admitting: Family Medicine

## 2016-08-20 VITALS — BP 104/70 | HR 84 | Temp 99.1°F | Resp 16 | Ht 62.0 in | Wt 132.6 lb

## 2016-08-20 DIAGNOSIS — J029 Acute pharyngitis, unspecified: Secondary | ICD-10-CM | POA: Diagnosis not present

## 2016-08-20 DIAGNOSIS — R509 Fever, unspecified: Secondary | ICD-10-CM

## 2016-08-20 DIAGNOSIS — J101 Influenza due to other identified influenza virus with other respiratory manifestations: Secondary | ICD-10-CM

## 2016-08-20 LAB — POCT RAPID STREP A (OFFICE): Rapid Strep A Screen: NEGATIVE

## 2016-08-20 LAB — POC INFLUENZA A&B (BINAX/QUICKVUE)
INFLUENZA A, POC: NEGATIVE
Influenza B, POC: POSITIVE — AB

## 2016-08-20 MED ORDER — OSELTAMIVIR PHOSPHATE 75 MG PO CAPS: ORAL_CAPSULE | ORAL | 0 refills | Status: DC

## 2016-08-20 NOTE — Progress Notes (Signed)
Pre visit review using our clinic review tool, if applicable. No additional management support is needed unless otherwise documented below in the visit note. 

## 2016-08-20 NOTE — Patient Instructions (Signed)

## 2016-08-20 NOTE — Progress Notes (Signed)
Subjective:  I acted as a Education administrator for Dr. Royden Purl, LPN    Patient ID: Terry Chang, female    DOB: 03-14-1981, 36 y.o.   MRN: [id number]  No chief complaint on file.   HPI  Patient is in today for c/o fevers, chills, cough, and sore throat since Friday. Patient also report she's beginning to experience pressure under both eyes. Patient she has been taking cold and flu medication, the last time she has taken the medication was 2 hrs prior to appointment.  Patient Care Team: Ann Held, DO as PCP - General (Family Medicine)   Past Medical History:  Diagnosis Date  . Asthma     No past surgical history on file.  Family History  Problem Relation Age of Onset  . Alcohol abuse Father   . Breast cancer Maternal Aunt     Mothers aunt  . Prostate cancer Maternal Grandfather   . Heart disease Paternal Grandfather   . Heart attack Paternal Grandfather     Social History   Social History  . Marital status: Divorced    Spouse name: N/A  . Number of children: 2  . Years of education: N/A   Occupational History  . [employer]   Social History Main Topics  . Smoking status: Former Smoker    Packs/day: 0.20    Years: 7.00    Quit date: 11/01/2000  . Smokeless tobacco: Never Used  . Alcohol use Yes     Comment: OCC  . Drug use: No  . Sexual activity: Yes    Partners: Male    Birth control/ protection: None   Other Topics Concern  . Not on file   Social History Narrative   Exercise---  Walking and weights    Outpatient Medications Prior to Visit  Medication Sig Dispense Refill  . albuterol (VENTOLIN HFA) 108 (90 Base) MCG/ACT inhaler Inhale 2 puffs into the lungs every 6 (six) hours as needed for wheezing. 1 Inhaler 5  . montelukast (SINGULAIR) 10 MG tablet Take 1 tablet (10 mg total) by mouth at bedtime. 90 tablet 3  . azithromycin (ZITHROMAX) 250 MG tablet As directed (Patient not taking: Reported on 08/20/2016) 6 each 0  .  HYDROcodone-homatropine (HYCODAN) 5-1.5 MG/5ML syrup Take 5 mLs by mouth every 8 (eight) hours as needed for cough. (Patient not taking: Reported on 08/20/2016) 120 mL 0   No facility-administered medications prior to visit.     No Known Allergies  Review of Systems  Constitutional: Positive for chills, fever and malaise/fatigue.  HENT: Negative for congestion.   Eyes: Negative for blurred vision.  Respiratory: Negative for shortness of breath.   Cardiovascular: Negative for chest pain, palpitations and leg swelling.  Gastrointestinal: Negative for abdominal pain, blood in stool and nausea.  Genitourinary: Negative for dysuria and frequency.  Musculoskeletal: Negative for falls.  Skin: Negative for rash.  Neurological: Negative for dizziness, loss of consciousness and headaches.  Endo/Heme/Allergies: Negative for environmental allergies.  Psychiatric/Behavioral: Negative for depression. The patient is not nervous/anxious.        Objective:    Physical Exam  Constitutional: She is oriented to person, place, and time. She appears well-developed and well-nourished.  HENT:  Head: Normocephalic and atraumatic.  Nose: Rhinorrhea present.  Eyes: Conjunctivae and EOM are normal.  Neck: Normal range of motion. Neck supple. No JVD present. Carotid bruit is not present. No thyromegaly present.  Cardiovascular: Normal rate, regular rhythm and normal heart sounds.  No murmur heard. Pulmonary/Chest: Effort normal and breath sounds normal. No respiratory distress. She has no wheezes. She has no rales. She exhibits no tenderness.  Musculoskeletal: She exhibits no edema.  Neurological: She is alert and oriented to person, place, and time.  Psychiatric: She has a normal mood and affect.  Nursing note and vitals reviewed.   BP 104/70 (BP Location: Left Arm, Patient Position: Sitting, Cuff Size: Normal)   Pulse 84   Temp 99.1 F (37.3 C) (Oral)   Resp 16   Ht 5\' 2"  (1.575 m)   Wt 132 lb 9.6  oz (60.1 kg)   SpO2 96%   BMI 24.25 kg/m  Wt Readings from Last 3 Encounters:  08/20/16 132 lb 9.6 oz (60.1 kg)  04/02/16 128 lb (58.1 kg)  11/08/15 117 lb 6.4 oz (53.3 kg)   BP Readings from Last 3 Encounters:  08/20/16 104/70  04/02/16 111/72  11/08/15 106/71     Immunization History  Administered Date(s) Administered  . Influenza-Unspecified 01/12/2014  . Tdap 09/17/2009    Health Maintenance  Topic Date Due  . INFLUENZA VACCINE  12/12/2016  . PAP SMEAR  11/01/2017  . TETANUS/TDAP  09/18/2019  . HIV Screening  Completed    Lab Results  Component Value Date   WBC 7.8 11/08/2015   HGB 13.1 11/08/2015   HCT 39.0 11/08/2015   PLT 307.0 11/08/2015   GLUCOSE 76 11/08/2015   CHOL 168 11/08/2015   TRIG 66.0 11/08/2015   HDL 80.80 11/08/2015   LDLDIRECT 93.5 11/14/2011   LDLCALC 74 11/08/2015   ALT 26 11/08/2015   AST 23 11/08/2015   NA 135 11/08/2015   K 3.9 11/08/2015   CL 101 11/08/2015   CREATININE 0.77 11/08/2015   BUN 10 11/08/2015   CO2 27 11/08/2015   TSH 2.01 11/22/2015    Lab Results  Component Value Date   TSH 2.01 11/22/2015   Lab Results  Component Value Date   WBC 7.8 11/08/2015   HGB 13.1 11/08/2015   HCT 39.0 11/08/2015   MCV 91.5 11/08/2015   PLT 307.0 11/08/2015   Lab Results  Component Value Date   NA 135 11/08/2015   K 3.9 11/08/2015   CO2 27 11/08/2015   GLUCOSE 76 11/08/2015   BUN 10 11/08/2015   CREATININE 0.77 11/08/2015   BILITOT 1.5 (H) 11/08/2015   ALKPHOS 62 11/08/2015   AST 23 11/08/2015   ALT 26 11/08/2015   PROT 7.3 11/08/2015   ALBUMIN 4.5 11/08/2015   CALCIUM 9.5 11/08/2015   GFR 89.68 11/08/2015   Lab Results  Component Value Date   CHOL 168 11/08/2015   Lab Results  Component Value Date   HDL 80.80 11/08/2015   Lab Results  Component Value Date   LDLCALC 74 11/08/2015   Lab Results  Component Value Date   TRIG 66.0 11/08/2015   Lab Results  Component Value Date   CHOLHDL 2 11/08/2015   No  results found for: HGBA1C       Assessment & Plan:   Problem List Items Addressed This Visit    None    Visit Diagnoses    Influenza B    -  Primary   Relevant Medications   oseltamivir (TAMIFLU) 75 MG capsule   Low grade fever       Relevant Orders   POC Influenza A&B (Binax test) (Completed)   Sore throat       Relevant Orders   POCT rapid strep A (  Completed)    otc cough/ cold-- advil / tylenol prn Get plenty of rest  rto prn   I have discontinued Ms. Tillman's HYDROcodone-homatropine and azithromycin. I am also having her start on oseltamivir. Additionally, I am having her maintain her montelukast and albuterol.  Meds ordered this encounter  Medications  . oseltamivir (TAMIFLU) 75 MG capsule    Sig: Take 1 capsule PO twice daily    Dispense:  20 capsule    Refill:  0    CMA served as scribe during this visit. History, Physical and Plan performed by medical provider. Documentation and orders reviewed and attested to.  Ann Held, DO   Patient ID: ANIHYA TUMA, female   DOB: [birth year], 36 y.o.   MRN: [id number]

## 2016-09-14 ENCOUNTER — Encounter: Payer: Self-pay | Admitting: Family Medicine

## 2016-09-14 NOTE — Telephone Encounter (Signed)
If she feels it is not normal for her --she should be seen She can go to [facility] tomorrow or we can see her next week

## 2016-11-09 ENCOUNTER — Ambulatory Visit (INDEPENDENT_AMBULATORY_CARE_PROVIDER_SITE_OTHER): Payer: 59 | Admitting: Family Medicine

## 2016-11-09 ENCOUNTER — Encounter: Payer: Self-pay | Admitting: Family Medicine

## 2016-11-09 VITALS — BP 100/70 | HR 77 | Temp 98.1°F | Resp 16 | Ht 62.4 in | Wt 135.4 lb

## 2016-11-09 DIAGNOSIS — Z0000 Encounter for general adult medical examination without abnormal findings: Secondary | ICD-10-CM | POA: Diagnosis not present

## 2016-11-09 DIAGNOSIS — L247 Irritant contact dermatitis due to plants, except food: Secondary | ICD-10-CM

## 2016-11-09 LAB — CBC WITH DIFFERENTIAL/PLATELET
BASOS ABS: 0 10*3/uL (ref 0.0–0.1)
Basophils Relative: 0.4 % (ref 0.0–3.0)
EOS ABS: 0.2 10*3/uL (ref 0.0–0.7)
Eosinophils Relative: 3.5 % (ref 0.0–5.0)
HCT: 36.6 % (ref 36.0–46.0)
Hemoglobin: 12.6 g/dL (ref 12.0–15.0)
LYMPHS ABS: 1.7 10*3/uL (ref 0.7–4.0)
Lymphocytes Relative: 36.6 % (ref 12.0–46.0)
MCHC: 34.5 g/dL (ref 30.0–36.0)
MCV: 95 fl (ref 78.0–100.0)
MONO ABS: 0.3 10*3/uL (ref 0.1–1.0)
MONOS PCT: 6.6 % (ref 3.0–12.0)
NEUTROS ABS: 2.4 10*3/uL (ref 1.4–7.7)
NEUTROS PCT: 52.9 % (ref 43.0–77.0)
PLATELETS: 321 10*3/uL (ref 150.0–400.0)
RBC: 3.86 Mil/uL — ABNORMAL LOW (ref 3.87–5.11)
RDW: 12.5 % (ref 11.5–15.5)
WBC: 4.6 10*3/uL (ref 4.0–10.5)

## 2016-11-09 LAB — POC URINALSYSI DIPSTICK (AUTOMATED)
BILIRUBIN UA: NEGATIVE
Glucose, UA: NEGATIVE
KETONES UA: NEGATIVE
Leukocytes, UA: NEGATIVE
Nitrite, UA: NEGATIVE
PH UA: 6 (ref 5.0–8.0)
Protein, UA: NEGATIVE
RBC UA: NEGATIVE
SPEC GRAV UA: 1.025 (ref 1.010–1.025)
Urobilinogen, UA: 0.2 E.U./dL

## 2016-11-09 LAB — COMPREHENSIVE METABOLIC PANEL
ALT: 32 U/L (ref 0–35)
AST: 36 U/L (ref 0–37)
Albumin: 4.4 g/dL (ref 3.5–5.2)
Alkaline Phosphatase: 50 U/L (ref 39–117)
BILIRUBIN TOTAL: 0.6 mg/dL (ref 0.2–1.2)
BUN: 13 mg/dL (ref 6–23)
CO2: 27 meq/L (ref 19–32)
Calcium: 9.4 mg/dL (ref 8.4–10.5)
Chloride: 104 mEq/L (ref 96–112)
Creatinine, Ser: 0.91 mg/dL (ref 0.40–1.20)
GFR: 73.56 mL/min (ref >60.00)
GLUCOSE: 94 mg/dL (ref 70–99)
Potassium: 4.4 mEq/L (ref 3.5–5.1)
Sodium: 137 mEq/L (ref 135–145)
Total Protein: 7.2 g/dL (ref 6.0–8.3)

## 2016-11-09 LAB — TSH: TSH: 1.53 u[IU]/mL (ref 0.35–4.50)

## 2016-11-09 LAB — LIPID PANEL
CHOL/HDL RATIO: 2
Cholesterol: 161 mg/dL (ref 0–200)
HDL: 75.5 mg/dL (ref >39.00)
LDL Cholesterol: 69 mg/dL (ref 0–99)
NONHDL: 85.16
Triglycerides: 82 mg/dL (ref 0.0–149.0)
VLDL: 16.4 mg/dL (ref 0.0–40.0)

## 2016-11-09 MED ORDER — PREDNISONE 10 MG PO TABS: ORAL_TABLET | ORAL | 0 refills | Status: DC

## 2016-11-09 MED ORDER — METHYLPREDNISOLONE ACETATE 80 MG/ML IJ SUSP
80.0000 mg | Freq: Once | INTRAMUSCULAR | Status: AC
  Administered 2016-11-09: 80 mg via INTRAMUSCULAR

## 2016-11-09 MED ORDER — MONTELUKAST SODIUM 10 MG PO TABS: 10.0000 mg | ORAL_TABLET | Freq: Every day | ORAL | 3 refills | Status: DC

## 2016-11-09 NOTE — Progress Notes (Signed)
Subjective:   I acted as a Education administrator for Dr. Carollee Herter.  Guerry Bruin, CMA   Terry Chang is a 36 y.o. female and is here for a comprehensive physical exam. The patient reports + itchy rash on arms, legs, chest , neck and face-- she thinks she got poison ivy from her dog.  Rash started Tuesday and has been progressively getting worse.    Social History   Social History  . Marital status: Divorced    Spouse name: N/A  . Number of children: 2  . Years of education: N/A   Occupational History  . [employer]   Social History Main Topics  . Smoking status: Former Smoker    Packs/day: 0.20    Years: 7.00    Quit date: 11/01/2000  . Smokeless tobacco: Never Used  . Alcohol use Yes     Comment: OCC  . Drug use: No  . Sexual activity: Yes    Partners: Male    Birth control/ protection: None   Other Topics Concern  . Not on file   Social History Narrative   Exercise---  Walking and weights   Health Maintenance  Topic Date Due  . INFLUENZA VACCINE  12/12/2016  . PAP SMEAR  11/01/2017  . TETANUS/TDAP  09/18/2019  . HIV Screening  Completed    The following portions of the patient's history were reviewed and updated as appropriate:  She  has a past medical history of Asthma. She  does not have any pertinent problems on file. She  has no past surgical history on file. Her family history includes Alcohol abuse in her father; Breast cancer in her maternal aunt; Heart attack in her paternal grandfather; Heart disease in her paternal grandfather; Prostate cancer in her maternal grandfather. She  reports that she quit smoking about 16 years ago. She has a 1.40 pack-year smoking history. She has never used smokeless tobacco. She reports that she drinks alcohol. She reports that she does not use drugs. She has a current medication list which includes the following prescription(s): albuterol and montelukast. Current Outpatient Prescriptions on File Prior to Visit   Medication Sig Dispense Refill  . albuterol (VENTOLIN HFA) 108 (90 Base) MCG/ACT inhaler Inhale 2 puffs into the lungs every 6 (six) hours as needed for wheezing. 1 Inhaler 5  . montelukast (SINGULAIR) 10 MG tablet Take 1 tablet (10 mg total) by mouth at bedtime. 90 tablet 3   No current facility-administered medications on file prior to visit.    She has No Known Allergies..  Review of Systems Review of Systems  Constitutional: Negative for activity change, appetite change and fatigue.  HENT: Negative for hearing loss, congestion, tinnitus and ear discharge.  dentist q23m Eyes: Negative for visual disturbance (see optho q1y -- vision corrected to 20/20 with glasses).  Respiratory: Negative for cough, chest tightness and shortness of breath.   Cardiovascular: Negative for chest pain, palpitations and leg swelling.  Gastrointestinal: Negative for abdominal pain, diarrhea, constipation and abdominal distention.  Genitourinary: Negative for urgency, frequency, decreased urine volume and difficulty urinating.  Musculoskeletal: Negative for back pain, arthralgias and gait problem.  Skin: Negative for color change, pallor-- + pruritic rash Neurological: Negative for dizziness, light-headedness, numbness and headaches.  Hematological: Negative for adenopathy. Does not bruise/bleed easily.  Psychiatric/Behavioral: Negative for suicidal ideas, confusion, sleep disturbance, self-injury, dysphoric mood, decreased concentration and agitation.       Objective:    BP 100/70 (BP Location: Left Arm, Cuff Size: Normal)  Pulse 77   Temp 98.1 F (36.7 C) (Oral)   Resp 16   Ht 5' 2.4" (1.585 m)   Wt 135 lb 6.4 oz (61.4 kg)   SpO2 98%   BMI 24.45 kg/m  General appearance: alert, cooperative, appears stated age and no distress Head: Normocephalic, without obvious abnormality, atraumatic Eyes: conjunctivae/corneas clear. PERRL, EOM's intact. Fundi benign. Ears: normal TM's and external ear  canals both ears Nose: Nares normal. Septum midline. Mucosa normal. No drainage or sinus tenderness. Throat: lips, mucosa, and tongue normal; teeth and gums normal Neck: no adenopathy, no carotid bruit, no JVD, supple, symmetrical, trachea midline and thyroid not enlarged, symmetric, no tenderness/mass/nodules Back: symmetric, no curvature. ROM normal. No CVA tenderness. Lungs: clear to auscultation bilaterally Breasts: normal appearance, no masses or tenderness Heart: regular rate and rhythm, S1, S2 normal, no murmur, click, rub or gallop Abdomen: soft, non-tender; bowel sounds normal; no masses,  no organomegaly Pelvic: deferred Extremities: extremities normal, atraumatic, no cyanosis or edema Pulses: 2+ and symmetric Skin: vesicular rash -- chest, arms, legs , neck and face Lymph nodes: Cervical, supraclavicular, and axillary nodes normal. Neurologic: Alert and oriented X 3, normal strength and tone. Normal symmetric reflexes. Normal coordination and gait    Assessment:    Healthy female exam.      Plan:    ghm utd Check labs See After Visit Summary for Counseling Recommendations    1. Preventative health care See above - POCT Urinalysis Dipstick (Automated) - CBC with Differential/Platelet - Comprehensive metabolic panel - Lipid panel - TSH - montelukast (SINGULAIR) 10 MG tablet; Take 1 tablet (10 mg total) by mouth at bedtime.  Dispense: 90 tablet; Refill: 3  2. Contact dermatitis and eczema due to plant Depo medrol 80 mg IM Benadryl prn  - predniSONE (DELTASONE) 10 MG tablet; TAKE 3 TABLETS PO QD FOR 3 DAYS THEN TAKE 2 TABLETS PO QD FOR 3 DAYS THEN TAKE 1 TABLET PO QD FOR 3 DAYS THEN TAKE 1/2 TAB PO QD FOR 3 DAYS  Dispense: 20 tablet; Refill: 0

## 2016-11-09 NOTE — Patient Instructions (Signed)
Preventive Care 18-39 Years, Female Preventive care refers to lifestyle choices and visits with your health care provider that can promote health and wellness. What does preventive care include?  A yearly physical exam. This is also called an annual well check.  Dental exams once or twice a year.  Routine eye exams. Ask your health care provider how often you should have your eyes checked.  Personal lifestyle choices, including: ? Daily care of your teeth and gums. ? Regular physical activity. ? Eating a healthy diet. ? Avoiding tobacco and drug use. ? Limiting alcohol use. ? Practicing safe sex. ? Taking vitamin and mineral supplements as recommended by your health care provider. What happens during an annual well check? The services and screenings done by your health care provider during your annual well check will depend on your age, overall health, lifestyle risk factors, and family history of disease. Counseling Your health care provider may ask you questions about your:  Alcohol use.  Tobacco use.  Drug use.  Emotional well-being.  Home and relationship well-being.  Sexual activity.  Eating habits.  Work and work Statistician.  Method of birth control.  Menstrual cycle.  Pregnancy history.  Screening You may have the following tests or measurements:  Height, weight, and BMI.  Diabetes screening. This is done by checking your blood sugar (glucose) after you have not eaten for a while (fasting).  Blood pressure.  Lipid and cholesterol levels. These may be checked every 5 years starting at age 66.  Skin check.  Hepatitis C blood test.  Hepatitis B blood test.  Sexually transmitted disease (STD) testing.  BRCA-related cancer screening. This may be done if you have a family history of breast, ovarian, tubal, or peritoneal cancers.  Pelvic exam and Pap test. This may be done every 3 years starting at age 40. Starting at age 59, this may be done every 5  years if you have a Pap test in combination with an HPV test.  Discuss your test results, treatment options, and if necessary, the need for more tests with your health care provider. Vaccines Your health care provider may recommend certain vaccines, such as:  Influenza vaccine. This is recommended every year.  Tetanus, diphtheria, and acellular pertussis (Tdap, Td) vaccine. You may need a Td booster every 10 years.  Varicella vaccine. You may need this if you have not been vaccinated.  HPV vaccine. If you are 69 or younger, you may need three doses over 6 months.  Measles, mumps, and rubella (MMR) vaccine. You may need at least one dose of MMR. You may also need a second dose.  Pneumococcal 13-valent conjugate (PCV13) vaccine. You may need this if you have certain conditions and were not previously vaccinated.  Pneumococcal polysaccharide (PPSV23) vaccine. You may need one or two doses if you smoke cigarettes or if you have certain conditions.  Meningococcal vaccine. One dose is recommended if you are age 27-21 years and a first-year college student living in a residence hall, or if you have one of several medical conditions. You may also need additional booster doses.  Hepatitis A vaccine. You may need this if you have certain conditions or if you travel or work in places where you may be exposed to hepatitis A.  Hepatitis B vaccine. You may need this if you have certain conditions or if you travel or work in places where you may be exposed to hepatitis B.  Haemophilus influenzae type b (Hib) vaccine. You may need this if  you have certain risk factors.  Talk to your health care provider about which screenings and vaccines you need and how often you need them. This information is not intended to replace advice given to you by your health care provider. Make sure you discuss any questions you have with your health care provider. Document Released: 06/26/2001 Document Revised: 01/18/2016  Document Reviewed: 03/01/2015 Elsevier Interactive Patient Education  2017 [employer].

## 2016-11-10 ENCOUNTER — Encounter: Payer: Self-pay | Admitting: Family Medicine

## 2017-02-05 ENCOUNTER — Encounter: Payer: 59 | Admitting: Family Medicine

## 2017-11-18 ENCOUNTER — Encounter (INDEPENDENT_AMBULATORY_CARE_PROVIDER_SITE_OTHER): Payer: Self-pay | Admitting: Orthopaedic Surgery

## 2017-11-18 ENCOUNTER — Ambulatory Visit (INDEPENDENT_AMBULATORY_CARE_PROVIDER_SITE_OTHER): Payer: 59 | Admitting: Orthopaedic Surgery

## 2017-11-18 DIAGNOSIS — S86112A Strain of other muscle(s) and tendon(s) of posterior muscle group at lower leg level, left leg, initial encounter: Secondary | ICD-10-CM | POA: Diagnosis not present

## 2017-11-18 NOTE — Progress Notes (Signed)
Office Visit Note   Patient: Terry Chang           Date of Birth: 15-Dec-1980           MRN: [id number] Visit Date: 11/18/2017              Requested by: [id number] [place], [place] [address] [place], [place] [address] PCP: Carollee Herter, Alferd Apa, DO   Assessment & Plan: Visit Diagnoses:  1. Gastrocnemius tear, left, initial encounter     Plan: I did give her reassurance that her Achilles is intact.  This is a tear of though of the muscle near the musculotendinous junction.  She is refrain from any type of contact sports or running or high impact aerobic activities for the next 2 to 3 months.  I do feel that a compressive wrap such as Ace wrap may be helpful to her as well.  She can try anti-inflammatories and I will put at least a thick heel lift to wear in her shoe just on that side for at least the next 4 to 6 weeks.  All questions concerns were answered and addressed.  Follow-up will be as needed.  Follow-Up Instructions: Return if symptoms worsen or fail to improve.   Orders:  No orders of the defined types were placed in this encounter.  No orders of the defined types were placed in this encounter.     Procedures: No procedures performed   Clinical Data: No additional findings.   Subjective: Chief Complaint  Patient presents with  . Left Ankle - Follow-up  The patient somewhat I am seeing for the first time.  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  About a week and a half ago she stepped wrong and twisted her ankle felt a significant pain in her calf area on her left side.  This then happened again yesterday and is gotten more painful to walk and put weight on this area.  She is never injured this area before.  She is non-smoker nondiabetic.  She is on birth control.  She denies any numbness and tingling in her feet and denies any knee pain.  HPI  Review of Systems She currently denies any  headache, chest pain, shortness of breath, fever, chills, nausea, vomiting.  Objective: Vital Signs: There were no vitals taken for this visit.  Physical Exam She is alert and oriented x3 and in no acute distress Ortho Exam Examination of her left calf she has a negative Thompson test.  Achilles is intact.  She has significant pain in the medial gastroc area at the mid substance of the muscle.  She is severely painful in this area with slight swelling as well.  The remainder of her knee foot and ankle exam are all entirely normal. Specialty Comments:  No specialty comments available.  Imaging: No results found.   PMFS History: Patient Active Problem List   Diagnosis Date Noted  . Insomnia 08/20/2012  . Hot flashes 08/20/2012  . Asthma in adult 09/18/2011   Past Medical History:  Diagnosis Date  . Asthma     Family History  Problem Relation Age of Onset  . Alcohol abuse Father   . Breast cancer Maternal Aunt        Mothers aunt  . Prostate cancer Maternal Grandfather   . Heart disease Paternal Grandfather   . Heart attack Paternal Grandfather  History reviewed. No pertinent surgical history. Social History   Occupational History  . Occupation: cma    Employer: [employer]  Tobacco Use  . Smoking status: Former Smoker    Packs/day: 0.20    Years: 7.00    Pack years: 1.40    Last attempt to quit: 11/01/2000    Years since quitting: 17.0  . Smokeless tobacco: Never Used  Substance and Sexual Activity  . Alcohol use: Yes    Comment: OCC  . Drug use: No  . Sexual activity: Yes    Partners: Male    Birth control/protection: None

## 2017-11-22 ENCOUNTER — Other Ambulatory Visit (HOSPITAL_COMMUNITY)
Admission: RE | Admit: 2017-11-22 | Discharge: 2017-11-22 | Disposition: A | Discharge status: Home / Self Care | Payer: 59 | Source: Ambulatory Visit | Attending: Family Medicine | Admitting: Family Medicine

## 2017-11-22 ENCOUNTER — Encounter: Payer: Self-pay | Admitting: Family Medicine

## 2017-11-22 ENCOUNTER — Ambulatory Visit (INDEPENDENT_AMBULATORY_CARE_PROVIDER_SITE_OTHER): Payer: 59 | Admitting: Family Medicine

## 2017-11-22 VITALS — BP 107/67 | HR 73 | Temp 98.8°F | Resp 16 | Ht 62.0 in | Wt 133.2 lb

## 2017-11-22 DIAGNOSIS — F419 Anxiety disorder, unspecified: Secondary | ICD-10-CM | POA: Diagnosis not present

## 2017-11-22 DIAGNOSIS — Z0000 Encounter for general adult medical examination without abnormal findings: Secondary | ICD-10-CM | POA: Diagnosis not present

## 2017-11-22 LAB — CBC WITH DIFFERENTIAL/PLATELET
BASOS ABS: 0 10*3/uL (ref 0.0–0.1)
BASOS PCT: 0.2 % (ref 0.0–3.0)
EOS ABS: 0.1 10*3/uL (ref 0.0–0.7)
Eosinophils Relative: 1.2 % (ref 0.0–5.0)
HEMATOCRIT: 38.6 % (ref 36.0–46.0)
HEMOGLOBIN: 13 g/dL (ref 12.0–15.0)
LYMPHS PCT: 30.6 % (ref 12.0–46.0)
Lymphs Abs: 1.7 10*3/uL (ref 0.7–4.0)
MCHC: 33.8 g/dL (ref 30.0–36.0)
MCV: 96.1 fl (ref 78.0–100.0)
MONOS PCT: 6.8 % (ref 3.0–12.0)
Monocytes Absolute: 0.4 10*3/uL (ref 0.1–1.0)
Neutro Abs: 3.4 10*3/uL (ref 1.4–7.7)
Neutrophils Relative %: 61.2 % (ref 43.0–77.0)
Platelets: 325 10*3/uL (ref 150.0–400.0)
RBC: 4.01 Mil/uL (ref 3.87–5.11)
RDW: 12.6 % (ref 11.5–15.5)
WBC: 5.6 10*3/uL (ref 4.0–10.5)

## 2017-11-22 LAB — COMPREHENSIVE METABOLIC PANEL WITH GFR
ALT: 16 U/L (ref 0–35)
AST: 18 U/L (ref 0–37)
Albumin: 4.2 g/dL (ref 3.5–5.2)
Alkaline Phosphatase: 45 U/L (ref 39–117)
BUN: 11 mg/dL (ref 6–23)
CO2: 28 meq/L (ref 19–32)
Calcium: 9.4 mg/dL (ref 8.4–10.5)
Chloride: 105 meq/L (ref 96–112)
Creatinine, Ser: 0.94 mg/dL (ref 0.40–1.20)
GFR: 70.47 mL/min
Glucose, Bld: 92 mg/dL (ref 70–99)
Potassium: 4.4 meq/L (ref 3.5–5.1)
Sodium: 139 meq/L (ref 135–145)
Total Bilirubin: 0.9 mg/dL (ref 0.2–1.2)
Total Protein: 6.5 g/dL (ref 6.0–8.3)

## 2017-11-22 LAB — LIPID PANEL
Cholesterol: 176 mg/dL (ref 0–200)
HDL: 78.5 mg/dL (ref >39.00)
LDL Cholesterol: 82 mg/dL (ref 0–99)
NONHDL: 97.01
Total CHOL/HDL Ratio: 2
Triglycerides: 73 mg/dL (ref 0.0–149.0)
VLDL: 14.6 mg/dL (ref 0.0–40.0)

## 2017-11-22 LAB — TSH: TSH: 1.14 u[IU]/mL (ref 0.35–4.50)

## 2017-11-22 MED ORDER — ALPRAZOLAM 0.25 MG PO TABS: 0.2500 mg | ORAL_TABLET | Freq: Three times a day (TID) | ORAL | 0 refills | Status: DC | PRN

## 2017-11-22 MED ORDER — ESCITALOPRAM OXALATE 10 MG PO TABS: 10.0000 mg | ORAL_TABLET | Freq: Every day | ORAL | 2 refills | Status: DC

## 2017-11-22 NOTE — Progress Notes (Signed)
Subjective:     Terry Chang is a 37 y.o. female and is here for a comprehensive physical exam. The patient reports increasing anxiety--- her daughter was in an accident last year and broke her pelvis and her son was kicked out of the house and is into drugs etc. .  Social History   Socioeconomic History  . Marital status: Single    Spouse name: Not on file  . Number of children: 2  . Years of education: Not on file  . Highest education level: Not on file  Occupational History  . Occupation: cma    Employer: [employer]  Social Needs  . Financial resource strain: Not on file  . Food insecurity:    Worry: Not on file    Inability: Not on file  . Transportation needs:    Medical: Not on file    Non-medical: Not on file  Tobacco Use  . Smoking status: Former Smoker    Packs/day: 0.20    Years: 7.00    Pack years: 1.40    Last attempt to quit: 11/01/2000    Years since quitting: 17.0  . Smokeless tobacco: Never Used  Substance and Sexual Activity  . Alcohol use: Yes    Comment: OCC  . Drug use: No  . Sexual activity: Yes    Partners: Male    Birth control/protection: None  Lifestyle  . Physical activity:    Days per week: Not on file    Minutes per session: Not on file  . Stress: Not on file  Relationships  . Social connections:    Talks on phone: Not on file    Gets together: Not on file    Attends religious service: Not on file    Active member of club or organization: Not on file    Attends meetings of clubs or organizations: Not on file    Relationship status: Not on file  . Intimate partner violence:    Fear of current or ex partner: Not on file    Emotionally abused: Not on file    Physically abused: Not on file    Forced sexual activity: Not on file  Other Topics Concern  . Not on file  Social History Narrative   Exercise---  Walking and weights   Health Maintenance  Topic Date Due  . PAP SMEAR  11/01/2017  . INFLUENZA VACCINE   12/12/2017  . TETANUS/TDAP  09/18/2019  . HIV Screening  Completed    The following portions of the patient's history were reviewed and updated as appropriate:  She  has a past medical history of Asthma. She does not have any pertinent problems on file. She  has no past surgical history on file. Her family history includes Alcohol abuse in her father; Breast cancer in her maternal aunt; Heart attack in her paternal grandfather; Heart disease in her paternal grandfather; Prostate cancer in her maternal grandfather. She  reports that she quit smoking about 17 years ago. She has a 1.40 pack-year smoking history. She has never used smokeless tobacco. She reports that she drinks alcohol. She reports that she does not use drugs. She has a current medication list which includes the following prescription(s): albuterol, montelukast, alprazolam, and escitalopram. Current Outpatient Medications on File Prior to Visit  Medication Sig Dispense Refill  . albuterol (VENTOLIN HFA) 108 (90 Base) MCG/ACT inhaler Inhale 2 puffs into the lungs every 6 (six) hours as needed for wheezing. 1 Inhaler 5  . montelukast (  SINGULAIR) 10 MG tablet Take 1 tablet (10 mg total) by mouth at bedtime. 90 tablet 3   No current facility-administered medications on file prior to visit.    She has No Known Allergies..  Review of Systems Review of Systems  Constitutional: Negative for activity change, appetite change and fatigue.  HENT: Negative for hearing loss, congestion, tinnitus and ear discharge.  dentist q82m Eyes: Negative for visual disturbance (see optho q1y -- vision corrected to 20/20 with glasses).  Respiratory: Negative for cough, chest tightness and shortness of breath.   Cardiovascular: Negative for chest pain, palpitations and leg swelling.  Gastrointestinal: Negative for abdominal pain, diarrhea, constipation and abdominal distention.  Genitourinary: Negative for urgency, frequency, decreased urine volume and  difficulty urinating.  Musculoskeletal: Negative for back pain, arthralgias and gait problem.  Skin: Negative for color change, pallor and rash.  Neurological: Negative for dizziness, light-headedness, numbness and headaches.  Hematological: Negative for adenopathy. Does not bruise/bleed easily.  Psychiatric/Behavioral: Negative for suicidal ideas, confusion, sleep disturbance, self-injury, dysphoric mood, decreased concentration and agitation.      Objective:    BP 107/67 (BP Location: Left Arm, Cuff Size: Normal)   Pulse 73   Temp 98.8 F (37.1 C) (Oral)   Resp 16   Ht 5\' 2"  (1.575 m)   Wt 133 lb 3.2 oz (60.4 kg)   SpO2 100%   BMI 24.36 kg/m  General appearance: alert, cooperative, appears stated age and mild distress Head: Normocephalic, without obvious abnormality, atraumatic Eyes: conjunctivae/corneas clear. PERRL, EOM's intact. Fundi benign. Ears: normal TM's and external ear canals both ears Nose: Nares normal. Septum midline. Mucosa normal. No drainage or sinus tenderness. Throat: lips, mucosa, and tongue normal; teeth and gums normal Neck: no adenopathy, no carotid bruit, no JVD, supple, symmetrical, trachea midline and thyroid not enlarged, symmetric, no tenderness/mass/nodules Back: symmetric, no curvature. ROM normal. No CVA tenderness. Lungs: clear to auscultation bilaterally Breasts: normal appearance, no masses or tenderness Heart: regular rate and rhythm, S1, S2 normal, no murmur, click, rub or gallop Abdomen: soft, non-tender; bowel sounds normal; no masses,  no organomegaly Pelvic: cervix normal in appearance, external genitalia normal, no adnexal masses or tenderness, no cervical motion tenderness, rectovaginal septum normal, uterus normal size, shape, and consistency, vagina normal without discharge and pap done Extremities: extremities normal, atraumatic, no cyanosis or edema Pulses: 2+ and symmetric Skin: Skin color, texture, turgor normal. No rashes or  lesions Lymph nodes: Cervical, supraclavicular, and axillary nodes normal. Neurologic: Alert and oriented X 3, normal strength and tone. Normal symmetric reflexes. Normal coordination and gait   psych-- very anxious, crying  Assessment:    Healthy female exam.      Plan:     ghm utd Check labs See After Visit Summary for Counseling Recommendations    1. Preventative health care See above - CBC with Differential/Platelet - Comprehensive metabolic panel - Lipid panel - TSH  2. Anxiety Pt will go for counseling through work Also gave her numbers for counselors here Start lexapro  Take xanax prn only  - escitalopram (LEXAPRO) 10 MG tablet; Take 1 tablet (10 mg total) by mouth daily.  Dispense: 30 tablet; Refill: 2 - ALPRAZolam (XANAX) 0.25 MG tablet; Take 1 tablet (0.25 mg total) by mouth 3 (three) times daily as needed for anxiety.  Dispense: 30 tablet; Refill: 0

## 2017-11-22 NOTE — Patient Instructions (Signed)
Preventive Care 18-39 Years, Female Preventive care refers to lifestyle choices and visits with your health care provider that can promote health and wellness. What does preventive care include?  A yearly physical exam. This is also called an annual well check.  Dental exams once or twice a year.  Routine eye exams. Ask your health care provider how often you should have your eyes checked.  Personal lifestyle choices, including: ? Daily care of your teeth and gums. ? Regular physical activity. ? Eating a healthy diet. ? Avoiding tobacco and drug use. ? Limiting alcohol use. ? Practicing safe sex. ? Taking vitamin and mineral supplements as recommended by your health care provider. What happens during an annual well check? The services and screenings done by your health care provider during your annual well check will depend on your age, overall health, lifestyle risk factors, and family history of disease. Counseling Your health care provider may ask you questions about your:  Alcohol use.  Tobacco use.  Drug use.  Emotional well-being.  Home and relationship well-being.  Sexual activity.  Eating habits.  Work and work Statistician.  Method of birth control.  Menstrual cycle.  Pregnancy history.  Screening You may have the following tests or measurements:  Height, weight, and BMI.  Diabetes screening. This is done by checking your blood sugar (glucose) after you have not eaten for a while (fasting).  Blood pressure.  Lipid and cholesterol levels. These may be checked every 5 years starting at age 66.  Skin check.  Hepatitis C blood test.  Hepatitis B blood test.  Sexually transmitted disease (STD) testing.  BRCA-related cancer screening. This may be done if you have a family history of breast, ovarian, tubal, or peritoneal cancers.  Pelvic exam and Pap test. This may be done every 3 years starting at age 40. Starting at age 59, this may be done every 5  years if you have a Pap test in combination with an HPV test.  Discuss your test results, treatment options, and if necessary, the need for more tests with your health care provider. Vaccines Your health care provider may recommend certain vaccines, such as:  Influenza vaccine. This is recommended every year.  Tetanus, diphtheria, and acellular pertussis (Tdap, Td) vaccine. You may need a Td booster every 10 years.  Varicella vaccine. You may need this if you have not been vaccinated.  HPV vaccine. If you are 69 or younger, you may need three doses over 6 months.  Measles, mumps, and rubella (MMR) vaccine. You may need at least one dose of MMR. You may also need a second dose.  Pneumococcal 13-valent conjugate (PCV13) vaccine. You may need this if you have certain conditions and were not previously vaccinated.  Pneumococcal polysaccharide (PPSV23) vaccine. You may need one or two doses if you smoke cigarettes or if you have certain conditions.  Meningococcal vaccine. One dose is recommended if you are age 27-21 years and a first-year college student living in a residence hall, or if you have one of several medical conditions. You may also need additional booster doses.  Hepatitis A vaccine. You may need this if you have certain conditions or if you travel or work in places where you may be exposed to hepatitis A.  Hepatitis B vaccine. You may need this if you have certain conditions or if you travel or work in places where you may be exposed to hepatitis B.  Haemophilus influenzae type b (Hib) vaccine. You may need this if  you have certain risk factors.  Talk to your health care provider about which screenings and vaccines you need and how often you need them. This information is not intended to replace advice given to you by your health care provider. Make sure you discuss any questions you have with your health care provider. Document Released: 06/26/2001 Document Revised: 01/18/2016  Document Reviewed: 03/01/2015 Elsevier Interactive Patient Education  [employer].

## 2017-11-25 ENCOUNTER — Encounter: Payer: Self-pay | Admitting: Family Medicine

## 2017-11-25 LAB — CYTOLOGY - PAP: DIAGNOSIS: NEGATIVE

## 2017-11-25 NOTE — Telephone Encounter (Signed)
I'm sorry that happened We can try a different one if she like---- prozac 10 mg #30  1 po qd, 2 refills

## 2017-12-27 ENCOUNTER — Ambulatory Visit (INDEPENDENT_AMBULATORY_CARE_PROVIDER_SITE_OTHER): Payer: 59 | Admitting: Family Medicine

## 2017-12-27 ENCOUNTER — Encounter: Payer: Self-pay | Admitting: Family Medicine

## 2017-12-27 VITALS — BP 116/58 | HR 77 | Temp 98.7°F | Resp 16 | Ht 62.0 in | Wt 136.8 lb

## 2017-12-27 DIAGNOSIS — F419 Anxiety disorder, unspecified: Secondary | ICD-10-CM

## 2017-12-27 DIAGNOSIS — Z0000 Encounter for general adult medical examination without abnormal findings: Secondary | ICD-10-CM

## 2017-12-27 MED ORDER — MONTELUKAST SODIUM 10 MG PO TABS: 10.0000 mg | ORAL_TABLET | Freq: Every day | ORAL | 3 refills | Status: DC

## 2017-12-27 NOTE — Patient Instructions (Signed)

## 2017-12-27 NOTE — Progress Notes (Signed)
Patient ID: Terry Chang, female    DOB: 1980-06-27  Age: 37 y.o. MRN: [id number]    Subjective:  Subjective  HPI Terry Chang presents for f/u anxiety-- she is off all meds.   The lexapro made her sick with abd pain.  She has not needed xanax.    Review of Systems  Constitutional: Negative for appetite change, diaphoresis, fatigue and unexpected weight change.  Eyes: Negative for pain, redness and visual disturbance.  Respiratory: Negative for cough, chest tightness, shortness of breath and wheezing.   Cardiovascular: Negative for chest pain, palpitations and leg swelling.  Endocrine: Negative for cold intolerance, heat intolerance, polydipsia, polyphagia and polyuria.  Genitourinary: Negative for difficulty urinating, dysuria and frequency.  Neurological: Negative for dizziness, light-headedness, numbness and headaches.  Psychiatric/Behavioral: Negative for agitation, behavioral problems, confusion, decreased concentration, sleep disturbance and suicidal ideas. The patient is not nervous/anxious.     History Past Medical History:  Diagnosis Date  . Asthma     She has no past surgical history on file.   Her family history includes Alcohol abuse in her father; Breast cancer in her maternal aunt; Heart attack in her paternal grandfather; Heart disease in her paternal grandfather; Prostate cancer in her maternal grandfather.She reports that she quit smoking about 17 years ago. She has a 1.40 pack-year smoking history. She has never used smokeless tobacco. She reports that she drinks alcohol. She reports that she does not use drugs.  Current Outpatient Medications on File Prior to Visit  Medication Sig Dispense Refill  . albuterol (VENTOLIN HFA) 108 (90 Base) MCG/ACT inhaler Inhale 2 puffs into the lungs every 6 (six) hours as needed for wheezing. 1 Inhaler 5   No current facility-administered medications on file prior to visit.      Objective:  Objective  Physical Exam    Constitutional: She is oriented to person, place, and time. She appears well-developed and well-nourished.  HENT:  Head: Normocephalic and atraumatic.  Eyes: Conjunctivae and EOM are normal.  Neck: Normal range of motion. Neck supple. No JVD present. Carotid bruit is not present. No thyromegaly present.  Cardiovascular: Normal rate, regular rhythm and normal heart sounds.  No murmur heard. Pulmonary/Chest: Effort normal and breath sounds normal. No respiratory distress. She has no wheezes. She has no rales. She exhibits no tenderness.  Musculoskeletal: She exhibits no edema.  Neurological: She is alert and oriented to person, place, and time.  Psychiatric: She has a normal mood and affect. Her behavior is normal. Judgment and thought content normal.  Nursing note and vitals reviewed.  BP (!) 116/58 (BP Location: Left Arm, Cuff Size: Normal)   Pulse 77   Temp 98.7 F (37.1 C) (Oral)   Resp 16   Ht 5\' 2"  (1.575 m)   Wt 136 lb 12.8 oz (62.1 kg)   SpO2 99%   BMI 25.02 kg/m  Wt Readings from Last 3 Encounters:  12/27/17 136 lb 12.8 oz (62.1 kg)  11/22/17 133 lb 3.2 oz (60.4 kg)  11/09/16 135 lb 6.4 oz (61.4 kg)     Lab Results  Component Value Date   WBC 5.6 11/22/2017   HGB 13.0 11/22/2017   HCT 38.6 11/22/2017   PLT 325.0 11/22/2017   GLUCOSE 92 11/22/2017   CHOL 176 11/22/2017   TRIG 73.0 11/22/2017   HDL 78.50 11/22/2017   LDLDIRECT 93.5 11/14/2011   LDLCALC 82 11/22/2017   ALT 16 11/22/2017   AST 18 11/22/2017   NA 139 11/22/2017  K 4.4 11/22/2017   CL 105 11/22/2017   CREATININE 0.94 11/22/2017   BUN 11 11/22/2017   CO2 28 11/22/2017   TSH 1.14 11/22/2017    No results found.   Assessment & Plan:  Plan  I have discontinued Terry Chang. Terry Chang's escitalopram and Terry Chang. I am also having her maintain her albuterol and montelukast.  Meds ordered this encounter  Medications  . montelukast (SINGULAIR) 10 MG tablet    Sig: Take 1 tablet (10 mg total) by  mouth at bedtime.    Dispense:  90 tablet    Refill:  3    Problem List Items Addressed This Visit      Unprioritized   Anxiety - Primary    Stable off meds rto prn        Other Visit Diagnoses    Preventative health care       Relevant Medications   montelukast (SINGULAIR) 10 MG tablet      Follow-up: Return in about 1 year (around 12/28/2018), or if symptoms worsen or fail to improve, for annual exam, fasting.  Ann Held, DO

## 2017-12-27 NOTE — Assessment & Plan Note (Signed)
Stable off meds rto prn

## 2018-05-24 DIAGNOSIS — B349 Viral infection, unspecified: Secondary | ICD-10-CM | POA: Diagnosis not present

## 2018-06-24 ENCOUNTER — Encounter: Payer: Self-pay | Admitting: Family Medicine

## 2018-11-12 ENCOUNTER — Other Ambulatory Visit: Payer: Self-pay | Admitting: Family Medicine

## 2018-11-12 DIAGNOSIS — Z1231 Encounter for screening mammogram for malignant neoplasm of breast: Secondary | ICD-10-CM

## 2018-12-09 ENCOUNTER — Other Ambulatory Visit: Payer: Self-pay | Admitting: Family Medicine

## 2018-12-09 ENCOUNTER — Ambulatory Visit
Admission: RE | Admit: 2018-12-09 | Discharge: 2018-12-09 | Disposition: A | Discharge status: Home / Self Care | Payer: BC Managed Care – PPO | Source: Ambulatory Visit

## 2018-12-09 ENCOUNTER — Other Ambulatory Visit: Payer: Self-pay

## 2018-12-09 DIAGNOSIS — Z1231 Encounter for screening mammogram for malignant neoplasm of breast: Secondary | ICD-10-CM | POA: Diagnosis not present

## 2018-12-11 ENCOUNTER — Encounter: Payer: Self-pay | Admitting: Family Medicine

## 2018-12-11 ENCOUNTER — Ambulatory Visit (INDEPENDENT_AMBULATORY_CARE_PROVIDER_SITE_OTHER): Payer: BC Managed Care – PPO | Admitting: Family Medicine

## 2018-12-11 ENCOUNTER — Other Ambulatory Visit: Payer: Self-pay

## 2018-12-11 DIAGNOSIS — Z0000 Encounter for general adult medical examination without abnormal findings: Secondary | ICD-10-CM

## 2018-12-11 DIAGNOSIS — J4521 Mild intermittent asthma with (acute) exacerbation: Secondary | ICD-10-CM

## 2018-12-11 MED ORDER — ALBUTEROL SULFATE HFA 108 (90 BASE) MCG/ACT IN AERS: 2.0000 | INHALATION_SPRAY | Freq: Four times a day (QID) | RESPIRATORY_TRACT | 3 refills | Status: DC | PRN

## 2018-12-11 MED ORDER — MONTELUKAST SODIUM 10 MG PO TABS: 10.0000 mg | ORAL_TABLET | Freq: Every day | ORAL | 3 refills | Status: DC

## 2018-12-11 NOTE — Patient Instructions (Signed)

## 2018-12-11 NOTE — Assessment & Plan Note (Signed)
ghm utd Check labs  See AVs  

## 2018-12-11 NOTE — Progress Notes (Signed)
New Patient Office Visit  Subjective:  Patient ID: Terry Chang, female    DOB: Jul 28, 1980  Age: 38 y.o. MRN: [id number]  CC:  Chief Complaint  Patient presents with  . Annual Exam    Pt states not fasting. No pap needed    HPI Terry Chang presents for cpe .     Past Medical History:  Diagnosis Date  . Asthma     No past surgical history on file.  Family History  Problem Relation Age of Onset  . Alcohol abuse Father   . Breast cancer Maternal Aunt        Mothers aunt  . Prostate cancer Maternal Grandfather   . Heart disease Paternal Grandfather   . Heart attack Paternal Grandfather     Social History   Socioeconomic History  . Marital status: Single    Spouse name: Not on file  . Number of children: 2  . Years of education: Not on file  . Highest education level: Not on file  Occupational History  . Occupation: cma    Employer: [employer]  Social Needs  . Financial resource strain: Not on file  . Food insecurity    Worry: Not on file    Inability: Not on file  . Transportation needs    Medical: Not on file    Non-medical: Not on file  Tobacco Use  . Smoking status: Former Smoker    Packs/day: 0.20    Years: 7.00    Pack years: 1.40    Quit date: 11/01/2000    Years since quitting: 18.1  . Smokeless tobacco: Never Used  Substance and Sexual Activity  . Alcohol use: Yes    Comment: OCC  . Drug use: No  . Sexual activity: Yes    Partners: Male    Birth control/protection: None  Lifestyle  . Physical activity    Days per week: Not on file    Minutes per session: Not on file  . Stress: Not on file  Relationships  . Social Herbalist on phone: Not on file    Gets together: Not on file    Attends religious service: Not on file    Active member of club or organization: Not on file    Attends meetings of clubs or organizations: Not on file    Relationship status: Not on file  . Intimate partner violence    Fear of current or ex partner: Not on file    Emotionally abused: Not on file    Physically abused: Not on file    Forced sexual activity: Not on file  Other Topics Concern  . Not on file  Social History Narrative   Exercise---  Walking and weights    ROS Review of Systems  Constitutional: Negative for chills and fever.  HENT: Negative for congestion and hearing loss.   Eyes: Negative for discharge.  Respiratory: Negative for cough and shortness of breath.   Cardiovascular: Negative for chest pain, palpitations and leg swelling.  Gastrointestinal: Negative for abdominal pain, blood in stool, constipation, diarrhea, nausea and vomiting.  Genitourinary: Negative for dysuria, frequency, hematuria and urgency.  Musculoskeletal: Negative for back pain and myalgias.  Skin: Negative for rash.  Allergic/Immunologic: Negative for environmental allergies.  Neurological: Negative for dizziness, weakness and headaches.  Hematological: Does not bruise/bleed easily.  Psychiatric/Behavioral: Negative for suicidal ideas. The patient is not nervous/anxious.     Objective:   Today's Vitals: BP 109/77 (  BP Location: Left Arm, Patient Position: Sitting, Cuff Size: Normal)   Pulse 83   Temp 98.4 F (36.9 C) (Oral)   Resp 18   Ht 5\' 2"  (1.575 m)   Wt 142 lb 12.8 oz (64.8 kg)   SpO2 98%   BMI 26.12 kg/m   Physical Exam Vitals signs and nursing note reviewed.  Constitutional:      General: She is not in acute distress.    Appearance: She is well-developed.  HENT:     Right Ear: External ear normal.     Left Ear: External ear normal.     Nose: Nose normal.  Eyes:     Pupils: Pupils are equal, round, and reactive to light.  Neck:     Musculoskeletal: Normal range of motion and neck supple.  Cardiovascular:     Rate and Rhythm: Normal rate and regular rhythm.     Heart sounds: Normal heart sounds. No murmur.  Pulmonary:     Effort: Pulmonary effort is normal. No respiratory distress.      Breath sounds: Normal breath sounds. No wheezing or rales.  Chest:     Chest wall: No tenderness.  Neurological:     Mental Status: She is alert and oriented to person, place, and time.  Psychiatric:        Behavior: Behavior normal.        Thought Content: Thought content normal.        Judgment: Judgment normal.     Assessment & Plan:   Problem List Items Addressed This Visit      Unprioritized   Preventative health care    ghm utd Check labs See AVs      Relevant Medications   montelukast (SINGULAIR) 10 MG tablet   Other Relevant Orders   TSH   Lipid panel   CBC with Differential/Platelet   Comprehensive metabolic panel    Other Visit Diagnoses    Mild intermittent asthma with acute exacerbation       Relevant Medications   montelukast (SINGULAIR) 10 MG tablet   albuterol (VENTOLIN HFA) 108 (90 Base) MCG/ACT inhaler      Outpatient Encounter Medications as of 12/11/2018  Medication Sig  . albuterol (VENTOLIN HFA) 108 (90 Base) MCG/ACT inhaler Inhale 2 puffs into the lungs every 6 (six) hours as needed for wheezing.  . montelukast (SINGULAIR) 10 MG tablet Take 1 tablet (10 mg total) by mouth at bedtime.  . [DISCONTINUED] albuterol (VENTOLIN HFA) 108 (90 Base) MCG/ACT inhaler Inhale 2 puffs into the lungs every 6 (six) hours as needed for wheezing.  . [DISCONTINUED] montelukast (SINGULAIR) 10 MG tablet Take 1 tablet (10 mg total) by mouth at bedtime.   No facility-administered encounter medications on file as of 12/11/2018.     Follow-up: Return in about 1 year (around 12/11/2019), or if symptoms worsen or fail to improve, for annual exam, fasting.   Ann Held, DOSubjective:         Objective:    ommendations

## 2018-12-12 LAB — COMPREHENSIVE METABOLIC PANEL
ALT: 30 U/L (ref 0–35)
AST: 27 U/L (ref 0–37)
Albumin: 4.5 g/dL (ref 3.5–5.2)
Alkaline Phosphatase: 56 U/L (ref 39–117)
BUN: 11 mg/dL (ref 6–23)
CO2: 26 mEq/L (ref 19–32)
Calcium: 9.6 mg/dL (ref 8.4–10.5)
Chloride: 102 mEq/L (ref 96–112)
Creatinine, Ser: 0.91 mg/dL (ref 0.40–1.20)
GFR: 68.46 mL/min (ref >60.00)
Glucose, Bld: 86 mg/dL (ref 70–99)
Potassium: 4 mEq/L (ref 3.5–5.1)
Sodium: 137 mEq/L (ref 135–145)
Total Bilirubin: 0.9 mg/dL (ref 0.2–1.2)
Total Protein: 7 g/dL (ref 6.0–8.3)

## 2018-12-12 LAB — CBC WITH DIFFERENTIAL/PLATELET
Basophils Absolute: 0.1 10*3/uL (ref 0.0–0.1)
Basophils Relative: 0.8 % (ref 0.0–3.0)
Eosinophils Absolute: 0.1 10*3/uL (ref 0.0–0.7)
Eosinophils Relative: 1.4 % (ref 0.0–5.0)
HCT: 39.2 % (ref 36.0–46.0)
Hemoglobin: 13.1 g/dL (ref 12.0–15.0)
Lymphocytes Relative: 34.4 % (ref 12.0–46.0)
Lymphs Abs: 2.4 10*3/uL (ref 0.7–4.0)
MCHC: 33.4 g/dL (ref 30.0–36.0)
MCV: 95.8 fl (ref 78.0–100.0)
Monocytes Absolute: 0.4 10*3/uL (ref 0.1–1.0)
Monocytes Relative: 5.8 % (ref 3.0–12.0)
Neutro Abs: 4 10*3/uL (ref 1.4–7.7)
Neutrophils Relative %: 57.6 % (ref 43.0–77.0)
Platelets: 357 10*3/uL (ref 150.0–400.0)
RBC: 4.09 Mil/uL (ref 3.87–5.11)
RDW: 12.1 % (ref 11.5–15.5)
WBC: 7 10*3/uL (ref 4.0–10.5)

## 2018-12-12 LAB — LIPID PANEL
Cholesterol: 200 mg/dL (ref 0–200)
HDL: 77.9 mg/dL (ref >39.00)
LDL Cholesterol: 90 mg/dL (ref 0–99)
NonHDL: 122.04
Total CHOL/HDL Ratio: 3
Triglycerides: 162 mg/dL — ABNORMAL HIGH (ref 0.0–149.0)
VLDL: 32.4 mg/dL (ref 0.0–40.0)

## 2018-12-12 LAB — TSH: TSH: 1.85 u[IU]/mL (ref 0.35–4.50)

## 2018-12-14 ENCOUNTER — Other Ambulatory Visit: Payer: Self-pay | Admitting: Family Medicine

## 2018-12-14 DIAGNOSIS — E785 Hyperlipidemia, unspecified: Secondary | ICD-10-CM

## 2019-01-01 ENCOUNTER — Other Ambulatory Visit: Payer: Self-pay | Admitting: Family Medicine

## 2019-01-01 DIAGNOSIS — Z0000 Encounter for general adult medical examination without abnormal findings: Secondary | ICD-10-CM

## 2019-01-02 ENCOUNTER — Encounter: Payer: Self-pay | Admitting: Family Medicine

## 2019-01-07 ENCOUNTER — Ambulatory Visit (INDEPENDENT_AMBULATORY_CARE_PROVIDER_SITE_OTHER): Payer: BC Managed Care – PPO | Admitting: Family Medicine

## 2019-01-07 ENCOUNTER — Other Ambulatory Visit: Payer: Self-pay

## 2019-01-07 ENCOUNTER — Encounter: Payer: Self-pay | Admitting: Family Medicine

## 2019-01-07 ENCOUNTER — Telehealth: Payer: Self-pay | Admitting: Family Medicine

## 2019-01-07 DIAGNOSIS — K219 Gastro-esophageal reflux disease without esophagitis: Secondary | ICD-10-CM

## 2019-01-07 DIAGNOSIS — Z20828 Contact with and (suspected) exposure to other viral communicable diseases: Secondary | ICD-10-CM | POA: Diagnosis not present

## 2019-01-07 MED ORDER — PANTOPRAZOLE SODIUM 40 MG PO TBEC: 40.0000 mg | DELAYED_RELEASE_TABLET | Freq: Every day | ORAL | 3 refills | Status: DC

## 2019-01-07 NOTE — Telephone Encounter (Signed)
LVM for patient to return call to schedule f/u early next week for GERD. DOS 01/07/19

## 2019-01-07 NOTE — Progress Notes (Signed)
Virtual Visit via Video Note  I connected with Terry Chang on 01/07/19 at  8:40 AM EDT by a video enabled telemedicine application and verified that I am speaking with the correct person using two identifiers.  Location: Patient: work Provider: home    I discussed the limitations of evaluation and management by telemedicine and the availability of in person appointments. The patient expressed understanding and agreed to proceed.  History of Present Illness: since sat pt has had chest pressure --- usually nexium otc takes care of it but she has been taking it tid with no relief-- she feels it is her reflux and did not want to go to the ER  No fever, no congestion,   Some light headed feeling    Past Medical History:  Diagnosis Date  . Asthma    Current Outpatient Medications on File Prior to Visit  Medication Sig Dispense Refill  . albuterol (VENTOLIN HFA) 108 (90 Base) MCG/ACT inhaler Inhale 2 puffs into the lungs every 6 (six) hours as needed for wheezing. 8 g 3  . montelukast (SINGULAIR) 10 MG tablet Take 1 tablet (10 mg total) by mouth at bedtime. 90 tablet 3   No current facility-administered medications on file prior to visit.     Observations/Objective: 98'3  No other vitals obtained Pt in nad    Assessment and  Plan:\ 1. Gastroesophageal reflux disease, esophagitis presence not specified protonix daily F/u this week for evaluation in the office and labs  Go to er if chest pain worsens  - pantoprazole (PROTONIX) 40 MG tablet; Take 1 tablet (40 mg total) by mouth daily.  Dispense: 30 tablet; Refill: 3    Follow Up Instructions:    I discussed the assessment and treatment plan with the patient. The patient was provided an opportunity to ask questions and all were answered. The patient agreed with the plan and demonstrated an understanding of the instructions.   The patient was advised to call back or seek an in-person evaluation if the symptoms worsen or if  the condition fails to improve as anticipated.  I provided 10 minutes of non-face-to-face time during this encounter.   Ann Held, DO

## 2019-01-12 ENCOUNTER — Other Ambulatory Visit: Payer: Self-pay

## 2019-01-13 ENCOUNTER — Ambulatory Visit (HOSPITAL_BASED_OUTPATIENT_CLINIC_OR_DEPARTMENT_OTHER)
Admission: RE | Admit: 2019-01-13 | Discharge: 2019-01-13 | Disposition: A | Discharge status: Home / Self Care | Payer: BC Managed Care – PPO | Source: Ambulatory Visit | Attending: Family Medicine | Admitting: Family Medicine

## 2019-01-13 ENCOUNTER — Encounter: Payer: Self-pay | Admitting: Family Medicine

## 2019-01-13 ENCOUNTER — Ambulatory Visit (INDEPENDENT_AMBULATORY_CARE_PROVIDER_SITE_OTHER): Payer: BC Managed Care – PPO | Admitting: Family Medicine

## 2019-01-13 VITALS — BP 106/70 | HR 77 | Temp 97.0°F | Resp 12 | Ht 62.4 in | Wt 146.8 lb

## 2019-01-13 DIAGNOSIS — R1013 Epigastric pain: Secondary | ICD-10-CM

## 2019-01-13 DIAGNOSIS — R079 Chest pain, unspecified: Secondary | ICD-10-CM | POA: Insufficient documentation

## 2019-01-13 LAB — COMPREHENSIVE METABOLIC PANEL
ALT: 29 U/L (ref 0–35)
AST: 23 U/L (ref 0–37)
Albumin: 4.2 g/dL (ref 3.5–5.2)
Alkaline Phosphatase: 56 U/L (ref 39–117)
BUN: 10 mg/dL (ref 6–23)
CO2: 27 mEq/L (ref 19–32)
Calcium: 9.4 mg/dL (ref 8.4–10.5)
Chloride: 101 mEq/L (ref 96–112)
Creatinine, Ser: 0.85 mg/dL (ref 0.40–1.20)
GFR: 74.03 mL/min (ref >60.00)
Glucose, Bld: 85 mg/dL (ref 70–99)
Potassium: 4.2 mEq/L (ref 3.5–5.1)
Sodium: 136 mEq/L (ref 135–145)
Total Bilirubin: 0.9 mg/dL (ref 0.2–1.2)
Total Protein: 6.8 g/dL (ref 6.0–8.3)

## 2019-01-13 LAB — CBC WITH DIFFERENTIAL/PLATELET
Basophils Absolute: 0 10*3/uL (ref 0.0–0.1)
Basophils Relative: 0.5 % (ref 0.0–3.0)
Eosinophils Absolute: 0.1 10*3/uL (ref 0.0–0.7)
Eosinophils Relative: 1.7 % (ref 0.0–5.0)
HCT: 37.5 % (ref 36.0–46.0)
Hemoglobin: 12.6 g/dL (ref 12.0–15.0)
Lymphocytes Relative: 39.3 % (ref 12.0–46.0)
Lymphs Abs: 1.8 10*3/uL (ref 0.7–4.0)
MCHC: 33.7 g/dL (ref 30.0–36.0)
MCV: 95.4 fl (ref 78.0–100.0)
Monocytes Absolute: 0.3 10*3/uL (ref 0.1–1.0)
Monocytes Relative: 5.9 % (ref 3.0–12.0)
Neutro Abs: 2.5 10*3/uL (ref 1.4–7.7)
Neutrophils Relative %: 52.6 % (ref 43.0–77.0)
Platelets: 344 10*3/uL (ref 150.0–400.0)
RBC: 3.93 Mil/uL (ref 3.87–5.11)
RDW: 12.2 % (ref 11.5–15.5)
WBC: 4.7 10*3/uL (ref 4.0–10.5)

## 2019-01-13 LAB — H. PYLORI ANTIBODY, IGG: H Pylori IgG: NEGATIVE

## 2019-01-13 NOTE — Progress Notes (Signed)
Patient ID: Terry Chang, female    DOB: January 09, 1981  Age: 38 y.o. MRN: [id number]    Subjective:  Subjective  HPI Terry Chang presents for f/u chest pain and gerd.  The discomfort is constant and exacerbated with deep breaths.  The protonix helped the gerd but did nothing for the chest pain.   No palpitations, no sob. No fever, no congestion and not relieved with asthma meds or otc meds.    Review of Systems  Constitutional: Negative for activity change, appetite change, fatigue and unexpected weight change.  Respiratory: Positive for chest tightness. Negative for cough, shortness of breath and wheezing.   Cardiovascular: Positive for chest pain. Negative for palpitations and leg swelling.  Psychiatric/Behavioral: Negative for behavioral problems and dysphoric mood. The patient is not nervous/anxious.     History Past Medical History:  Diagnosis Date  . Asthma     She has no past surgical history on file.   Her family history includes Alcohol abuse in her father; Breast cancer in her maternal aunt; Heart attack in her paternal grandfather; Heart disease in her paternal grandfather; Prostate cancer in her maternal grandfather.She reports that she quit smoking about 18 years ago. She has a 1.40 pack-year smoking history. She has never used smokeless tobacco. She reports current alcohol use. She reports that she does not use drugs.  Current Outpatient Medications on File Prior to Visit  Medication Sig Dispense Refill  . albuterol (VENTOLIN HFA) 108 (90 Base) MCG/ACT inhaler Inhale 2 puffs into the lungs every 6 (six) hours as needed for wheezing. 8 g 3  . montelukast (SINGULAIR) 10 MG tablet Take 1 tablet (10 mg total) by mouth at bedtime. 90 tablet 3  . pantoprazole (PROTONIX) 40 MG tablet Take 1 tablet (40 mg total) by mouth daily. 30 tablet 3   No current facility-administered medications on file prior to visit.      Objective:  Objective  Physical Exam Vitals  signs and nursing note reviewed.  Constitutional:      Appearance: She is well-developed.  HENT:     Head: Normocephalic and atraumatic.  Eyes:     Conjunctiva/sclera: Conjunctivae normal.  Neck:     Musculoskeletal: Normal range of motion and neck supple.     Thyroid: No thyromegaly.     Vascular: No carotid bruit or JVD.  Cardiovascular:     Rate and Rhythm: Normal rate and regular rhythm.     Heart sounds: Normal heart sounds. No murmur.  Pulmonary:     Effort: Pulmonary effort is normal. No respiratory distress.     Breath sounds: Normal breath sounds. No wheezing or rales.  Chest:     Chest wall: No tenderness.  Abdominal:     General: Abdomen is flat. There is no distension.     Palpations: Abdomen is soft. There is no mass.     Tenderness: There is no abdominal tenderness. There is no guarding or rebound.     Hernia: No hernia is present.  Neurological:     Mental Status: She is alert and oriented to person, place, and time.    BP 106/70 (BP Location: Right Arm, Cuff Size: Normal)   Pulse 77   Temp (!) 97 F (36.1 C) (Temporal)   Resp 12   Ht 5' 2.4" (1.585 m)   Wt 146 lb 12.8 oz (66.6 kg)   SpO2 98%   BMI 26.51 kg/m  Wt Readings from Last 3 Encounters:  01/13/19 146 lb 12.8  oz (66.6 kg)  12/11/18 142 lb 12.8 oz (64.8 kg)  12/27/17 136 lb 12.8 oz (62.1 kg)     Lab Results  Component Value Date   WBC 7.0 12/11/2018   HGB 13.1 12/11/2018   HCT 39.2 12/11/2018   PLT 357.0 12/11/2018   GLUCOSE 86 12/11/2018   CHOL 200 12/11/2018   TRIG 162.0 (H) 12/11/2018   HDL 77.90 12/11/2018   LDLDIRECT 93.5 11/14/2011   LDLCALC 90 12/11/2018   ALT 30 12/11/2018   AST 27 12/11/2018   NA 137 12/11/2018   K 4.0 12/11/2018   CL 102 12/11/2018   CREATININE 0.91 12/11/2018   BUN 11 12/11/2018   CO2 26 12/11/2018   TSH 1.85 12/11/2018    Mm 3d Screen Breast Bilateral  Result Date: 12/09/2018 CLINICAL DATA:  Screening. EXAM: DIGITAL SCREENING BILATERAL MAMMOGRAM  WITH TOMO AND CAD COMPARISON:  Previous exam(s). ACR Breast Density Category c: The breast tissue is heterogeneously dense, which may obscure small masses. FINDINGS: There are no findings suspicious for malignancy. Images were processed with CAD. IMPRESSION: No mammographic evidence of malignancy. A result letter of this screening mammogram will be mailed directly to the patient. RECOMMENDATION: Screening mammogram in one year. (Code:SM-B-01Y) BI-RADS CATEGORY  1: Negative. Electronically Signed   By: Curlene Dolphin M.D.   On: 12/09/2018 15:24     Assessment & Plan:  Plan  I am having Terry Alken. Chang maintain her montelukast, albuterol, and pantoprazole.  No orders of the defined types were placed in this encounter.   Problem List Items Addressed This Visit      Unprioritized   Chest pain    ekg normal Check cxr and labs  Consider GI referral      Relevant Orders   EKG 12-Lead (Completed)   DG Chest 2 View (Completed)   Dyspepsia - Primary    con't protonix Check labs Consider GI      Relevant Orders   CBC with Differential/Platelet   Comprehensive metabolic panel   H. pylori antibody, IgG      Follow-up: Return if symptoms worsen or fail to improve.  Ann Held, DO

## 2019-01-13 NOTE — Assessment & Plan Note (Signed)
con't protonix Check labs Consider GI

## 2019-01-13 NOTE — Assessment & Plan Note (Signed)
ekg normal Check cxr and labs  Consider GI referral

## 2019-01-13 NOTE — Patient Instructions (Signed)
Food Choices for Gastroesophageal Reflux Disease, Adult When you have gastroesophageal reflux disease (GERD), the foods you eat and your eating habits are very important. Choosing the right foods can help ease the discomfort of GERD. Consider working with a diet and nutrition specialist (dietitian) to help you make healthy food choices. What general guidelines should I follow?  Eating plan  Choose healthy foods low in fat, such as fruits, vegetables, whole grains, low-fat dairy products, and lean meat, fish, and poultry.  Eat frequent, small meals instead of three large meals each day. Eat your meals slowly, in a relaxed setting. Avoid bending over or lying down until 2-3 hours after eating.  Limit high-fat foods such as fatty meats or fried foods.  Limit your intake of oils, butter, and shortening to less than 8 teaspoons each day.  Avoid the following: ? Foods that cause symptoms. These may be different for different people. Keep a food diary to keep track of foods that cause symptoms. ? Alcohol. ? Drinking large amounts of liquid with meals. ? Eating meals during the 2-3 hours before bed.  Cook foods using methods other than frying. This may include baking, grilling, or broiling. Lifestyle  Maintain a healthy weight. Ask your health care provider what weight is healthy for you. If you need to lose weight, work with your health care provider to do so safely.  Exercise for at least 30 minutes on 5 or more days each week, or as told by your health care provider.  Avoid wearing clothes that fit tightly around your waist and chest.  Do not use any products that contain nicotine or tobacco, such as cigarettes and e-cigarettes. If you need help quitting, ask your health care provider.  Sleep with the head of your bed raised. Use a wedge under the mattress or blocks under the bed frame to raise the head of the bed. What foods are not recommended? The items listed may not be a complete  list. Talk with your dietitian about what dietary choices are best for you. Grains Pastries or quick breads with added fat. French toast. Vegetables Deep fried vegetables. French fries. Any vegetables prepared with added fat. Any vegetables that cause symptoms. For some people this may include tomatoes and tomato products, chili peppers, onions and garlic, and horseradish. Fruits Any fruits prepared with added fat. Any fruits that cause symptoms. For some people this may include citrus fruits, such as oranges, grapefruit, pineapple, and lemons. Meats and other protein foods High-fat meats, such as fatty beef or pork, hot dogs, ribs, ham, sausage, salami and bacon. Fried meat or protein, including fried fish and fried chicken. Nuts and nut butters. Dairy Whole milk and chocolate milk. Sour cream. Cream. Ice cream. Cream cheese. Milk shakes. Beverages Coffee and tea, with or without caffeine. Carbonated beverages. Sodas. Energy drinks. Fruit juice made with acidic fruits (such as orange or grapefruit). Tomato juice. Alcoholic drinks. Fats and oils Butter. Margarine. Shortening. Ghee. Sweets and desserts Chocolate and cocoa. Donuts. Seasoning and other foods Pepper. Peppermint and spearmint. Any condiments, herbs, or seasonings that cause symptoms. For some people, this may include curry, hot sauce, or vinegar-based salad dressings. Summary  When you have gastroesophageal reflux disease (GERD), food and lifestyle choices are very important to help ease the discomfort of GERD.  Eat frequent, small meals instead of three large meals each day. Eat your meals slowly, in a relaxed setting. Avoid bending over or lying down until 2-3 hours after eating.  Limit high-fat   foods such as fatty meat or fried foods. This information is not intended to replace advice given to you by your health care provider. Make sure you discuss any questions you have with your health care provider. Document Released:  04/30/2005 Document Revised: 08/21/2018 Document Reviewed: 05/01/2016 Elsevier Patient Education  2020 Elsevier Inc.  

## 2019-06-25 ENCOUNTER — Ambulatory Visit: Payer: Self-pay | Attending: Internal Medicine

## 2019-06-25 DIAGNOSIS — Z23 Encounter for immunization: Secondary | ICD-10-CM | POA: Insufficient documentation

## 2019-06-25 NOTE — Progress Notes (Signed)
   Covid-19 Vaccination Clinic  Name:  Terry Chang    MRN: [id number] DOB: [birth year]  06/25/2019  Ms. [name] was observed post Covid-19 immunization for 15 minutes without incidence. She was provided with Vaccine Information Sheet and instruction to access the V-Safe system.   Ms. [name] was instructed to call 911 with any severe reactions post vaccine: Marland Kitchen Difficulty breathing  . Swelling of your face and throat  . A fast heartbeat  . A bad rash all over your body  . Dizziness and weakness    Immunizations Administered    Name Date Dose VIS Date Route   Pfizer COVID-19 Vaccine 06/25/2019 11:23 AM 0.3 mL 04/24/2019 Intramuscular   Manufacturer: [place]   Lot: [id number]   [place]: [id number]

## 2019-07-08 ENCOUNTER — Other Ambulatory Visit: Payer: Self-pay | Admitting: *Deleted

## 2019-07-08 DIAGNOSIS — K219 Gastro-esophageal reflux disease without esophagitis: Secondary | ICD-10-CM

## 2019-07-08 MED ORDER — PANTOPRAZOLE SODIUM 40 MG PO TBEC: 40.0000 mg | DELAYED_RELEASE_TABLET | Freq: Every day | ORAL | 1 refills | Status: DC

## 2019-07-18 ENCOUNTER — Ambulatory Visit: Payer: Self-pay | Attending: Internal Medicine

## 2019-07-18 DIAGNOSIS — Z23 Encounter for immunization: Secondary | ICD-10-CM | POA: Insufficient documentation

## 2019-07-18 NOTE — Progress Notes (Signed)
   Covid-19 Vaccination Clinic  Name:  Terry Chang    MRN: [id number] DOB: 1980/05/18  07/18/2019  [name] was observed post Covid-19 immunization for 15 minutes without incident. She was provided with Vaccine Information Sheet and instruction to access the V-Safe system.   [name] was instructed to call 911 with any severe reactions post vaccine: Marland Kitchen Difficulty breathing  . Swelling of face and throat  . A fast heartbeat  . A bad rash all over body  . Dizziness and weakness   Immunizations Administered    Name Date Dose VIS Date Route   Pfizer COVID-19 Vaccine 07/18/2019 11:41 AM 0.3 mL 04/24/2019 Intramuscular   Manufacturer: [place]   Lot: [id number]   [place]: [id number]

## 2019-09-24 ENCOUNTER — Other Ambulatory Visit: Payer: Self-pay | Admitting: Family Medicine

## 2019-09-24 DIAGNOSIS — Z1231 Encounter for screening mammogram for malignant neoplasm of breast: Secondary | ICD-10-CM

## 2019-12-10 ENCOUNTER — Ambulatory Visit
Admission: RE | Admit: 2019-12-10 | Discharge: 2019-12-10 | Disposition: A | Discharge status: Home / Self Care | Payer: BC Managed Care – PPO | Source: Ambulatory Visit

## 2019-12-10 ENCOUNTER — Other Ambulatory Visit: Payer: Self-pay

## 2019-12-10 DIAGNOSIS — Z1231 Encounter for screening mammogram for malignant neoplasm of breast: Secondary | ICD-10-CM

## 2019-12-17 ENCOUNTER — Other Ambulatory Visit: Payer: Self-pay

## 2019-12-17 ENCOUNTER — Ambulatory Visit (INDEPENDENT_AMBULATORY_CARE_PROVIDER_SITE_OTHER): Payer: BC Managed Care – PPO | Admitting: Family Medicine

## 2019-12-17 ENCOUNTER — Encounter: Payer: Self-pay | Admitting: Family Medicine

## 2019-12-17 VITALS — BP 100/70 | HR 70 | Temp 98.2°F | Resp 18 | Ht 62.4 in | Wt 145.4 lb

## 2019-12-17 DIAGNOSIS — K219 Gastro-esophageal reflux disease without esophagitis: Secondary | ICD-10-CM

## 2019-12-17 DIAGNOSIS — Z0000 Encounter for general adult medical examination without abnormal findings: Secondary | ICD-10-CM

## 2019-12-17 DIAGNOSIS — J4521 Mild intermittent asthma with (acute) exacerbation: Secondary | ICD-10-CM

## 2019-12-17 LAB — COMPREHENSIVE METABOLIC PANEL
ALT: 46 U/L — ABNORMAL HIGH (ref 0–35)
AST: 35 U/L (ref 0–37)
Albumin: 4.1 g/dL (ref 3.5–5.2)
Alkaline Phosphatase: 51 U/L (ref 39–117)
BUN: 12 mg/dL (ref 6–23)
CO2: 26 mEq/L (ref 19–32)
Calcium: 9 mg/dL (ref 8.4–10.5)
Chloride: 105 mEq/L (ref 96–112)
Creatinine, Ser: 0.87 mg/dL (ref 0.40–1.20)
GFR: 71.74 mL/min (ref >60.00)
Glucose, Bld: 93 mg/dL (ref 70–99)
Potassium: 4.4 mEq/L (ref 3.5–5.1)
Sodium: 136 mEq/L (ref 135–145)
Total Bilirubin: 0.7 mg/dL (ref 0.2–1.2)
Total Protein: 6.7 g/dL (ref 6.0–8.3)

## 2019-12-17 LAB — CBC WITH DIFFERENTIAL/PLATELET
Basophils Absolute: 0 10*3/uL (ref 0.0–0.1)
Basophils Relative: 0.4 % (ref 0.0–3.0)
Eosinophils Absolute: 0.1 10*3/uL (ref 0.0–0.7)
Eosinophils Relative: 1.5 % (ref 0.0–5.0)
HCT: 37.6 % (ref 36.0–46.0)
Hemoglobin: 12.5 g/dL (ref 12.0–15.0)
Lymphocytes Relative: 31.1 % (ref 12.0–46.0)
Lymphs Abs: 1.7 10*3/uL (ref 0.7–4.0)
MCHC: 33.3 g/dL (ref 30.0–36.0)
MCV: 97.4 fl (ref 78.0–100.0)
Monocytes Absolute: 0.3 10*3/uL (ref 0.1–1.0)
Monocytes Relative: 5.7 % (ref 3.0–12.0)
Neutro Abs: 3.4 10*3/uL (ref 1.4–7.7)
Neutrophils Relative %: 61.3 % (ref 43.0–77.0)
Platelets: 295 10*3/uL (ref 150.0–400.0)
RBC: 3.86 Mil/uL — ABNORMAL LOW (ref 3.87–5.11)
RDW: 12.1 % (ref 11.5–15.5)
WBC: 5.6 10*3/uL (ref 4.0–10.5)

## 2019-12-17 LAB — LIPID PANEL
Cholesterol: 167 mg/dL (ref 0–200)
HDL: 71.7 mg/dL (ref >39.00)
LDL Cholesterol: 75 mg/dL (ref 0–99)
NonHDL: 94.88
Total CHOL/HDL Ratio: 2
Triglycerides: 100 mg/dL (ref 0.0–149.0)
VLDL: 20 mg/dL (ref 0.0–40.0)

## 2019-12-17 LAB — TSH: TSH: 2.42 u[IU]/mL (ref 0.35–4.50)

## 2019-12-17 MED ORDER — MONTELUKAST SODIUM 10 MG PO TABS: 10.0000 mg | ORAL_TABLET | Freq: Every day | ORAL | 3 refills | Status: DC

## 2019-12-17 MED ORDER — ALBUTEROL SULFATE HFA 108 (90 BASE) MCG/ACT IN AERS: 2.0000 | INHALATION_SPRAY | Freq: Four times a day (QID) | RESPIRATORY_TRACT | 3 refills | Status: DC | PRN

## 2019-12-17 MED ORDER — PANTOPRAZOLE SODIUM 40 MG PO TBEC: 40.0000 mg | DELAYED_RELEASE_TABLET | Freq: Every day | ORAL | 3 refills | Status: DC

## 2019-12-17 NOTE — Patient Instructions (Signed)

## 2019-12-17 NOTE — Progress Notes (Signed)
Subjective:     Terry Chang is a 39 y.o. female and is here for a comprehensive physical exam. The patient reports no problems.  Social History   Socioeconomic History  . Marital status: Single    Spouse name: Not on file  . Number of children: 2  . Years of education: Not on file  . Highest education level: Not on file  Occupational History  . Occupation: cma    Employer: [employer]  Tobacco Use  . Smoking status: Former Smoker    Packs/day: 0.20    Years: 7.00    Pack years: 1.40    Quit date: 11/01/2000    Years since quitting: 19.1  . Smokeless tobacco: Never Used  Substance and Sexual Activity  . Alcohol use: Yes    Comment: OCC  . Drug use: No  . Sexual activity: Yes    Partners: Male    Birth control/protection: None  Other Topics Concern  . Not on file  Social History Narrative   Exercise---  Walking and [employer]   Social Determinants of Health   Financial Resource Strain:   . Difficulty of Paying Living Expenses:   Food Insecurity:   . Worried About Charity fundraiser in the Last Year:   . Arboriculturist in the Last Year:   Transportation Needs:   . Film/video editor (Medical):   Marland Kitchen Lack of Transportation (Non-Medical):   Physical Activity:   . Days of Exercise per Week:   . Minutes of Exercise per Session:   Stress:   . Feeling of Stress :   Social Connections:   . Frequency of Communication with Friends and Family:   . Frequency of Social Gatherings with Friends and Family:   . Attends Religious Services:   . Active Member of Clubs or Organizations:   . Attends Archivist Meetings:   Marland Kitchen Marital Status:   Intimate Partner Violence:   . Fear of Current or Ex-Partner:   . Emotionally Abused:   Marland Kitchen Physically Abused:   . Sexually Abused:    Health Maintenance  Topic Date Due  . Hepatitis C Screening  Never done  . TETANUS/TDAP  09/18/2019  . INFLUENZA VACCINE  12/13/2019  . PAP SMEAR-Modifier  11/22/2020  .  COVID-19 Vaccine  Completed  . HIV Screening  Completed    The following portions of the patient's history were reviewed and updated as appropriate:  She  has a past medical history of Asthma. She does not have any pertinent problems on file. She  has no past surgical history on file. Her family history includes Alcohol abuse in her father; Breast cancer in her maternal aunt; Heart attack in her paternal grandfather; Heart disease in her paternal grandfather; Prostate cancer in her maternal grandfather. She  reports that she quit smoking about 19 years ago. She has a 1.40 pack-year smoking history. She has never used smokeless tobacco. She reports current alcohol use. She reports that she does not use drugs. She has a current medication list which includes the following prescription(s): albuterol, montelukast, and pantoprazole. No current outpatient medications on file prior to visit.   No current facility-administered medications on file prior to visit.   She is allergic to lexapro [escitalopram oxalate]..  Review of Systems Review of Systems  Constitutional: Negative for activity change, appetite change and fatigue.  HENT: Negative for hearing loss, congestion, tinnitus and ear discharge.  dentist q70m Eyes: Negative for visual disturbance (see  optho q1y -- vision corrected to 20/20 with glasses).  Respiratory: Negative for cough, chest tightness and shortness of breath.   Cardiovascular: Negative for chest pain, palpitations and leg swelling.  Gastrointestinal: Negative for abdominal pain, diarrhea, constipation and abdominal distention.  Genitourinary: Negative for urgency, frequency, decreased urine volume and difficulty urinating.  Musculoskeletal: Negative for back pain, arthralgias and gait problem.  Skin: Negative for color change, pallor and rash.  Neurological: Negative for dizziness, light-headedness, numbness and headaches.  Hematological: Negative for adenopathy. Does not  bruise/bleed easily.  Psychiatric/Behavioral: Negative for suicidal ideas, confusion, sleep disturbance, self-injury, dysphoric mood, decreased concentration and agitation.       Objective:    BP 100/70 (BP Location: Right Arm, Patient Position: Sitting, Cuff Size: Normal)   Pulse 70   Temp 98.2 F (36.8 C) (Oral)   Resp 18   Ht 5' 2.4" (1.585 m)   Wt 145 lb 6.4 oz (66 kg)   SpO2 100%   BMI 26.25 kg/m  General appearance: alert, cooperative, appears stated age and no distress Head: Normocephalic, without obvious abnormality, atraumatic Eyes: negative findings: lids and lashes normal, conjunctivae and sclerae normal and pupils equal, round, reactive to light and accomodation Ears: normal TM's and external ear canals both ears Nose: Nares normal. Septum midline. Mucosa normal. No drainage or sinus tenderness. Throat: lips, mucosa, and tongue normal; teeth and gums normal Neck: no adenopathy, no carotid bruit, no JVD, supple, symmetrical, trachea midline and thyroid not enlarged, symmetric, no tenderness/mass/nodules Back: symmetric, no curvature. ROM normal. No CVA tenderness. Lungs: clear to auscultation bilaterally and normal percussion bilaterally Breasts: normal appearance, no masses or tenderness Heart: regular rate and rhythm, S1, S2 normal, no murmur, click, rub or gallop Abdomen: soft, non-tender; bowel sounds normal; no masses,  no organomegaly Pelvic: deferred Extremities: extremities normal, atraumatic, no cyanosis or edema Pulses: 2+ and symmetric Skin: Skin color, texture, turgor normal. No rashes or lesions Lymph nodes: Cervical, supraclavicular, and axillary nodes normal. Neurologic: Alert and oriented X 3, normal strength and tone. Normal symmetric reflexes. Normal coordination and gait    Assessment:    Healthy female exam.   Plan:    ghm utd Check labs See After Visit Summary for Counseling Recommendations

## 2020-02-05 ENCOUNTER — Ambulatory Visit (HOSPITAL_COMMUNITY)
Admission: RE | Admit: 2020-02-05 | Discharge: 2020-02-05 | Disposition: A | Discharge status: Home / Self Care | Payer: BC Managed Care – PPO | Source: Ambulatory Visit | Attending: General Surgery | Admitting: General Surgery

## 2020-02-05 ENCOUNTER — Other Ambulatory Visit: Payer: Self-pay

## 2020-02-05 ENCOUNTER — Other Ambulatory Visit (HOSPITAL_COMMUNITY): Payer: Self-pay | Admitting: General Surgery

## 2020-02-05 DIAGNOSIS — M79605 Pain in left leg: Secondary | ICD-10-CM

## 2020-02-05 NOTE — Progress Notes (Signed)
Venous duplex completed.  Called results to [facility]. 1:23pm She will page Dr. Marlou Starks and call back with instructions.  1:53 PM Per Dr. Marlou Starks, patient can leave and he will call her with instructions today.   June Leap, BS, RDMS, RVT

## 2020-02-18 ENCOUNTER — Encounter (HOSPITAL_BASED_OUTPATIENT_CLINIC_OR_DEPARTMENT_OTHER): Payer: Self-pay

## 2020-02-18 ENCOUNTER — Encounter (HOSPITAL_COMMUNITY): Payer: Self-pay

## 2020-02-18 ENCOUNTER — Ambulatory Visit (INDEPENDENT_AMBULATORY_CARE_PROVIDER_SITE_OTHER): Payer: BC Managed Care – PPO | Admitting: Family Medicine

## 2020-02-18 ENCOUNTER — Other Ambulatory Visit: Payer: Self-pay

## 2020-02-18 ENCOUNTER — Encounter: Payer: Self-pay | Admitting: Family Medicine

## 2020-02-18 ENCOUNTER — Ambulatory Visit (HOSPITAL_COMMUNITY)
Admission: RE | Admit: 2020-02-18 | Discharge: 2020-02-18 | Disposition: A | Discharge status: Home / Self Care | Payer: BC Managed Care – PPO | Source: Ambulatory Visit | Attending: Family Medicine | Admitting: Family Medicine

## 2020-02-18 ENCOUNTER — Ambulatory Visit (HOSPITAL_BASED_OUTPATIENT_CLINIC_OR_DEPARTMENT_OTHER)
Admission: RE | Admit: 2020-02-18 | Discharge: 2020-02-18 | Disposition: A | Discharge status: Home / Self Care | Payer: BC Managed Care – PPO | Source: Ambulatory Visit | Attending: Family Medicine | Admitting: Family Medicine

## 2020-02-18 VITALS — BP 108/78 | HR 80 | Temp 98.1°F | Resp 12 | Ht 62.4 in | Wt 141.0 lb

## 2020-02-18 DIAGNOSIS — I82442 Acute embolism and thrombosis of left tibial vein: Secondary | ICD-10-CM | POA: Insufficient documentation

## 2020-02-18 DIAGNOSIS — R079 Chest pain, unspecified: Secondary | ICD-10-CM | POA: Diagnosis not present

## 2020-02-18 DIAGNOSIS — R0789 Other chest pain: Secondary | ICD-10-CM | POA: Insufficient documentation

## 2020-02-18 DIAGNOSIS — R0602 Shortness of breath: Secondary | ICD-10-CM | POA: Diagnosis not present

## 2020-02-18 MED ORDER — RIVAROXABAN 20 MG PO TABS: 20.0000 mg | ORAL_TABLET | Freq: Every day | ORAL | 1 refills | Status: DC

## 2020-02-18 MED ORDER — IOHEXOL 350 MG/ML SOLN
100.0000 mL | Freq: Once | INTRAVENOUS | Status: AC | PRN
  Administered 2020-02-18: 75 mL via INTRAVENOUS

## 2020-02-18 NOTE — Progress Notes (Signed)
Patient ID: Terry Chang, female    DOB: [birth year]  Age: 39 y.o. MRN: [id number]    Subjective:  Subjective  HPI Terry Chang presents for f/u dvt L lower ext---- vascular doc at work ordered US due to L calf pain.  She is taking xaralto   Review of Systems  History Past Medical History:  Diagnosis Date  . Asthma     She has no past surgical history on file.   Her family history includes Alcohol abuse in her father; Breast cancer in her maternal aunt; Heart attack in her paternal grandfather; Heart disease in her paternal grandfather; Prostate cancer in her maternal grandfather.She reports that she quit smoking about 19 years ago. She has a 1.40 pack-year smoking history. She has never used smokeless tobacco. She reports current alcohol use. She reports that she does not use drugs.  Current Outpatient Medications on File Prior to Visit  Medication Sig Dispense Refill  . albuterol (VENTOLIN HFA) 108 (90 Base) MCG/ACT inhaler Inhale 2 puffs into the lungs every 6 (six) hours as needed for wheezing. 8 g 3  . montelukast (SINGULAIR) 10 MG tablet Take 1 tablet (10 mg total) by mouth at bedtime. 90 tablet 3  . pantoprazole (PROTONIX) 40 MG tablet Take 1 tablet (40 mg total) by mouth daily. 90 tablet 3  . XARELTO STARTER PACK Take 1 packet by mouth as directed.     No current facility-administered medications on file prior to visit.     Objective:  Objective  Physical Exam BP 108/78 (BP Location: Right Arm, Cuff Size: Normal)   Pulse 71   Temp 98.1 F (36.7 C) (Oral)   Resp 12   Ht 5' 2.4" (1.585 m)   Wt 141 lb (64 kg)   SpO2 94%   BMI 25.46 kg/m  Wt Readings from Last 3 Encounters:  02/18/20 141 lb (64 kg)  12/17/19 145 lb 6.4 oz (66 kg)  01/13/19 146 lb 12.8 oz (66.6 kg)     Lab Results  Component Value Date   WBC 5.6 12/17/2019   HGB 12.5 12/17/2019   HCT 37.6 12/17/2019   PLT 295.0 12/17/2019   GLUCOSE 93 12/17/2019   CHOL 167 12/17/2019   TRIG  100.0 12/17/2019   HDL 71.70 12/17/2019   LDLDIRECT 93.5 11/14/2011   LDLCALC 75 12/17/2019   ALT 46 (H) 12/17/2019   AST 35 12/17/2019   NA 136 12/17/2019   K 4.4 12/17/2019   CL 105 12/17/2019   CREATININE 0.87 12/17/2019   BUN 12 12/17/2019   CO2 26 12/17/2019   TSH 2.42 12/17/2019    VAS [place] LOWER EXTREMITY VENOUS (DVT)  Result Date: 02/05/2020  Lower Venous DVTStudy Indications: Calf pain.  Risk Factors: None identified. Comparison Study: none Performing Technologist: June Leap RDMS, RVT  Examination Guidelines: A complete evaluation includes B-mode imaging, spectral Doppler, color Doppler, and power Doppler as needed of all accessible portions of each vessel. Bilateral testing is considered an integral part of a complete examination. Limited examinations for reoccurring indications may be performed as noted. The reflux portion of the exam is performed with the patient in reverse Trendelenburg.  +-----+---------------+---------+-----------+----------+--------------+ RIGHTCompressibilityPhasicitySpontaneityPropertiesThrombus Aging +-----+---------------+---------+-----------+----------+--------------+ CFV  Full           Yes      Yes                                 +-----+---------------+---------+-----------+----------+--------------+   +---------+---------------+---------+-----------+----------+--------------+  LEFT     CompressibilityPhasicitySpontaneityPropertiesThrombus Aging +---------+---------------+---------+-----------+----------+--------------+ CFV      Full           Yes      Yes                                 +---------+---------------+---------+-----------+----------+--------------+ SFJ      Full                                                        +---------+---------------+---------+-----------+----------+--------------+ FV Prox  Full                                                         +---------+---------------+---------+-----------+----------+--------------+ FV Mid   Full                                                        +---------+---------------+---------+-----------+----------+--------------+ FV DistalFull                                                        +---------+---------------+---------+-----------+----------+--------------+ PFV      Full                                                        +---------+---------------+---------+-----------+----------+--------------+ POP      Full           Yes      Yes                                 +---------+---------------+---------+-----------+----------+--------------+ PTV      None                                         Acute          +---------+---------------+---------+-----------+----------+--------------+ PERO     Full                                                        +---------+---------------+---------+-----------+----------+--------------+     Summary: RIGHT: - No evidence of common femoral vein obstruction.  LEFT: - Findings consistent with acute deep vein thrombosis involving the left posterior tibial veins. - No cystic structure found in the popliteal fossa.  *See table(s) above for measurements and observations. Electronically signed by Servando Snare  MD on 02/05/2020 at 1:30:48 PM.    Final      Assessment & Plan:  Plan  I am having Terry Chang. Rester start on rivaroxaban. I am also having her maintain her albuterol, montelukast, pantoprazole, and Xarelto Starter Pack.  Meds ordered this encounter  Medications  . rivaroxaban (XARELTO) 20 MG TABS tablet    Sig: Take 1 tablet (20 mg total) by mouth daily with supper.    Dispense:  30 tablet    Refill:  1    Problem List Items Addressed This Visit      Unprioritized   Acute deep vein thrombosis (DVT) of tibial vein of left lower extremity (HCC) - Primary   Relevant Medications   XARELTO STARTER PACK   rivaroxaban  (XARELTO) 20 MG TABS tablet   Other Relevant Orders   DG Chest 2 View   Ambulatory referral to Hematology      Follow-up: No follow-ups on file.  Ann Held, DO

## 2020-02-18 NOTE — Patient Instructions (Signed)

## 2020-02-19 ENCOUNTER — Telehealth: Payer: Self-pay | Admitting: Hematology

## 2020-02-19 NOTE — Telephone Encounter (Signed)
Received a new hem referral from Dr. Etter Sjogren for Terry Chang. Terry Chang has been cld and scheduled to see Dr. Irene Limbo on 11/4 at 11am. Pt aware to arrive 20 minutes early.

## 2020-02-25 ENCOUNTER — Encounter: Payer: Self-pay | Admitting: Family Medicine

## 2020-03-17 ENCOUNTER — Encounter: Payer: BC Managed Care – PPO | Admitting: Hematology

## 2020-03-22 ENCOUNTER — Other Ambulatory Visit: Payer: Self-pay

## 2020-03-22 ENCOUNTER — Inpatient Hospital Stay: Payer: BC Managed Care – PPO | Attending: Hematology | Admitting: Hematology

## 2020-03-22 VITALS — BP 116/76 | HR 87 | Temp 97.9°F | Resp 20 | Ht 62.4 in | Wt 138.5 lb

## 2020-03-22 DIAGNOSIS — I82442 Acute embolism and thrombosis of left tibial vein: Secondary | ICD-10-CM | POA: Diagnosis not present

## 2020-03-22 NOTE — Progress Notes (Signed)
HEMATOLOGY/ONCOLOGY CONSULTATION NOTE  Date of Service: 03/22/2020  Patient Care Team: Carollee Herter, Alferd Apa, DO as PCP - General (Family Medicine)  CHIEF COMPLAINTS/PURPOSE OF CONSULTATION:  DVT  HISTORY OF PRESENTING ILLNESS:   Terry Chang is a wonderful 39 y.o. female who has been referred to [place] by Dr. Etter Sjogren for evaluation and management of DVT. The pt reports that she is doing well overall.   The pt reports that her asthma has been well-controlled recently and she does not require her inhaler regularly. Pt had a partial tear in her left gastrocnemius nearly two years ago. This injury did not require surgery. Pt takes Singular nightly for her asthma and Pantoprazole to control her acid reflux. She was recently placed on Xarelto after her DVT. She denies any other chronic medical conditions or medications.   Pt has occasional pain from her previous gastrocnemius injury, but denies any abnormal left calf pain or discomfort prior to her DVT. Pt did not do any long-distance traveling during the week prior to her blood clot. She was slightly more sedentary the weekend before the discovery of her blood clot, due to not feeling well. Pt denies any recent infections. She has no known chemical exposure at work or through her hobbies. Pt has never had any concerns with Varicose veins.   Pt has been pregnant twice and has two children. 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 All of these pregnancies were lost within the first few months. Pt has been using the Mirena IUD for the last 8 years.   She has had regular facial flushing recently.   Of note prior to the patient's visit today, pt has had [place] LOWER EXTREMITY VENOUS ([id number]) completed on 02/05/2020 with results revealing "RIGHT: - No evidence of common femoral vein obstruction. LEFT - Findings consistent with acute deep vein thrombosis involving the left posterior tibial veins. - No cystic structure found in the  popliteal fossa."  Most recent lab results (12/17/2019) of CBC is as follows: all values are WNL except for RBC at 3.86, ALT at 46.  On review of systems, pt reports flushing and denies left calf pain/swelling, fevers, chills, night sweats, unexpected weight loss, abdominal pain and any other symptoms.   On PMHx the pt reports Asthma, Reflux, Injury to the Gastrocnemius. On Social Hx the pt reports that she quit smoking about 20 years ago. Pt only drinks alcohol socially. On Family Hx the pt reports that her maternal aunt had Breast Cancer in her 67's. She denies any family history of blood clots/disorders or autoimmune conditions.   MEDICAL HISTORY:  Past Medical History:  Diagnosis Date  . Asthma     SURGICAL HISTORY: No past surgical history on file.  SOCIAL HISTORY: Social History   Socioeconomic History  . Marital status: Single    Spouse name: Not on file  . Number of children: 2  . Years of education: Not on file  . Highest education level: Not on file  Occupational History  . Occupation: cma    Employer: [employer]  Tobacco Use  . Smoking status: Former Smoker    Packs/day: 0.20    Years: 7.00    Pack years: 1.40    Quit date: 11/01/2000    Years since quitting: 19.4  . Smokeless tobacco: Never Used  Substance and Sexual Activity  . Alcohol use: Yes    Comment: OCC  . Drug use: No  . Sexual activity: Yes  Partners: Male    Birth control/protection: None  Other Topics Concern  . Not on file  Social History Narrative   Exercise---  Walking and [employer]   Social Determinants of Health   Financial Resource Strain:   . Difficulty of Paying Living Expenses: Not on file  Food Insecurity:   . Worried About Charity fundraiser in the Last Year: Not on file  . Ran Out of Food in the Last Year: Not on file  Transportation Needs:   . Lack of Transportation (Medical): Not on file  . Lack of Transportation (Non-Medical): Not on file  Physical  Activity:   . Days of Exercise per Week: Not on file  . Minutes of Exercise per Session: Not on file  Stress:   . Feeling of Stress : Not on file  Social Connections:   . Frequency of Communication with Friends and Family: Not on file  . Frequency of Social Gatherings with Friends and Family: Not on file  . Attends Religious Services: Not on file  . Active Member of Clubs or Organizations: Not on file  . Attends Archivist Meetings: Not on file  . Marital Status: Not on file  Intimate Partner Violence:   . Fear of Current or Ex-Partner: Not on file  . Emotionally Abused: Not on file  . Physically Abused: Not on file  . Sexually Abused: Not on file    FAMILY HISTORY: Family History  Problem Relation Age of Onset  . Alcohol abuse Father   . Breast cancer Maternal Aunt        Mothers aunt  . Prostate cancer Maternal Grandfather   . Heart disease Paternal Grandfather   . Heart attack Paternal Grandfather     ALLERGIES:  is allergic to lexapro [escitalopram oxalate].  MEDICATIONS:  Current Outpatient Medications  Medication Sig Dispense Refill  . albuterol (VENTOLIN HFA) 108 (90 Base) MCG/ACT inhaler Inhale 2 puffs into the lungs every 6 (six) hours as needed for wheezing. 8 g 3  . montelukast (SINGULAIR) 10 MG tablet Take 1 tablet (10 mg total) by mouth at bedtime. 90 tablet 3  . pantoprazole (PROTONIX) 40 MG tablet Take 1 tablet (40 mg total) by mouth daily. 90 tablet 3  . rivaroxaban (XARELTO) 20 MG TABS tablet Take 1 tablet (20 mg total) by mouth daily with supper. 30 tablet 1   No current facility-administered medications for this visit.    REVIEW OF SYSTEMS:    10 Point review of Systems was done is negative except as noted above.  PHYSICAL EXAMINATION: ECOG PERFORMANCE STATUS: 0 - Asymptomatic  . Vitals:   03/22/20 1315  BP: 116/76  Pulse: 87  Resp: 20  Temp: 97.9 F (36.6 C)  SpO2: 98%   Filed Weights   03/22/20 1315  Weight: 138 lb 8 oz  (62.8 kg)   .Body mass index is 25.01 kg/m.  GENERAL:alert, in no acute distress and comfortable SKIN: no acute rashes, no significant lesions EYES: conjunctiva are pink and non-injected, sclera anicteric OROPHARYNX: MMM, no exudates, no oropharyngeal erythema or ulceration NECK: supple, no JVD LYMPH:  no palpable lymphadenopathy in the cervical, axillary or inguinal regions LUNGS: clear to auscultation b/l with normal respiratory effort HEART: regular rate & rhythm ABDOMEN:  normoactive bowel sounds , non tender, not distended. Extremity: no pedal edema PSYCH: alert & oriented x 3 with fluent speech NEURO: no focal motor/sensory deficits  LABORATORY DATA:  I have reviewed the data as listed  .  CBC Latest Ref Rng & Units 12/17/2019 01/13/2019 12/11/2018  WBC 4.0 - 10.5 K/uL 5.6 4.7 7.0  Hemoglobin 12.0 - 15.0 g/dL 12.5 12.6 13.1  Hematocrit 36 - 46 % 37.6 37.5 39.2  Platelets 150 - 400 K/uL 295.0 344.0 357.0    . CMP Latest Ref Rng & Units 12/17/2019 01/13/2019 12/11/2018  Glucose 70 - 99 mg/dL 93 85 86  BUN 6 - 23 mg/dL 12 10 11   Creatinine 0.40 - 1.20 mg/dL 0.87 0.85 0.91  Sodium 135 - 145 mEq/L 136 136 137  Potassium 3.5 - 5.1 mEq/L 4.4 4.2 4.0  Chloride 96 - 112 mEq/L 105 101 102  CO2 19 - 32 mEq/L 26 27 26   Calcium 8.4 - 10.5 mg/dL 9.0 9.4 9.6  Total Protein 6.0 - 8.3 g/dL 6.7 6.8 7.0  Total Bilirubin 0.2 - 1.2 mg/dL 0.7 0.9 0.9  Alkaline Phos 39 - 117 U/L 51 56 56  AST 0 - 37 U/L 35 23 27  ALT 0 - 35 U/L 46(H) 29 30     RADIOGRAPHIC STUDIES: I have personally reviewed the radiological images as listed and agreed with the findings in the report. No results found.  ASSESSMENT & PLAN:  39 yo with   1) Acute left posterior tibial vein DVT No overt triggering event. Previous calf muscle injury of left side --gastrocnemius tear ? Scar tissue. PLAN: -Discussed patient's most recent labs from 12/17/2019, no polycythemia or thrombocytopenia.  -Advised pt that her lack of  personal or family history of blood clots and two successful pregnancies suggest against a blood clotting disorder. -Advised pt that IUDs use hormones in a localized manner and are not as much of a risk factor as oral contraceptives.  -Advised pt that a more extensive blood clot with a modifiable provoking factor would be treated with anticoagulation for 6-9 months.  -Advised pt that when a provoking factor can not be identified for an extensive clot we would continue anticoagulation for life.  -Advised pt that her previous gastrocnemius injury is the most obvious provoking factor. -Recommend a hypercoagulable w/o to r/o any genetic risk factors - pt declines hypercoagulable w/o at this time.  -Recommend pt continue Xarelto for a total of 3 months before switching to a baby ASA - provided no symptoms in 3 months. -Recommend pt continue age-appropriate cancer screenings with her PCP. -Will see back as needed  FOLLOW UP: RTC with Dr Irene Limbo as needed  All of the patients questions were answered with apparent satisfaction. The patient knows to call the clinic with any problems, questions or concerns.  I spent 30 mins counseling the patient face to face. The total time spent in the appointment was 45 minutes and more than 50% was on counseling and direct patient cares.    Sullivan Lone MD [place] AAHIVMS [facility] Hematology/Oncology Physician [facility]  (Office):       [phone] (Work cell):  [phone] (Fax):           [phone]  03/22/2020 5:29 PM  I, Yevette Edwards, am acting as a scribe for Dr. Sullivan Lone.   .I have reviewed the above documentation for accuracy and completeness, and I agree with the above. Brunetta Genera MD

## 2020-05-04 ENCOUNTER — Other Ambulatory Visit: Payer: Self-pay | Admitting: Family Medicine

## 2020-05-04 ENCOUNTER — Encounter: Payer: Self-pay | Admitting: Family Medicine

## 2020-05-04 DIAGNOSIS — I82442 Acute embolism and thrombosis of left tibial vein: Secondary | ICD-10-CM

## 2020-06-16 ENCOUNTER — Emergency Department (HOSPITAL_BASED_OUTPATIENT_CLINIC_OR_DEPARTMENT_OTHER)
Admission: EM | Admit: 2020-06-16 | Discharge: 2020-06-16 | Discharge status: Absent without leave | Payer: BC Managed Care – PPO

## 2020-06-30 ENCOUNTER — Other Ambulatory Visit: Payer: Self-pay | Admitting: Family Medicine

## 2020-06-30 DIAGNOSIS — I82442 Acute embolism and thrombosis of left tibial vein: Secondary | ICD-10-CM

## 2020-08-16 ENCOUNTER — Other Ambulatory Visit: Payer: Self-pay | Admitting: Family Medicine

## 2020-08-16 DIAGNOSIS — Z1231 Encounter for screening mammogram for malignant neoplasm of breast: Secondary | ICD-10-CM

## 2020-09-01 ENCOUNTER — Other Ambulatory Visit: Payer: Self-pay | Admitting: Family Medicine

## 2020-09-01 ENCOUNTER — Other Ambulatory Visit: Payer: Self-pay

## 2020-09-01 DIAGNOSIS — I82442 Acute embolism and thrombosis of left tibial vein: Secondary | ICD-10-CM

## 2020-09-01 MED ORDER — RIVAROXABAN 20 MG PO TABS: 20.0000 mg | ORAL_TABLET | Freq: Every day | ORAL | 5 refills | Status: DC

## 2020-12-07 ENCOUNTER — Encounter: Payer: Self-pay | Admitting: Family Medicine

## 2020-12-12 DIAGNOSIS — Z1231 Encounter for screening mammogram for malignant neoplasm of breast: Secondary | ICD-10-CM

## 2020-12-19 ENCOUNTER — Encounter: Payer: BC Managed Care – PPO | Admitting: Family Medicine

## 2021-01-03 ENCOUNTER — Other Ambulatory Visit: Payer: Self-pay | Admitting: Family Medicine

## 2021-01-03 DIAGNOSIS — K219 Gastro-esophageal reflux disease without esophagitis: Secondary | ICD-10-CM

## 2021-01-03 DIAGNOSIS — Z0000 Encounter for general adult medical examination without abnormal findings: Secondary | ICD-10-CM

## 2021-01-11 ENCOUNTER — Other Ambulatory Visit: Payer: Self-pay | Admitting: Family Medicine

## 2021-01-11 DIAGNOSIS — Z1231 Encounter for screening mammogram for malignant neoplasm of breast: Secondary | ICD-10-CM

## 2021-02-06 ENCOUNTER — Other Ambulatory Visit: Payer: Self-pay | Admitting: Family Medicine

## 2021-02-06 DIAGNOSIS — Z0000 Encounter for general adult medical examination without abnormal findings: Secondary | ICD-10-CM

## 2021-02-06 DIAGNOSIS — K219 Gastro-esophageal reflux disease without esophagitis: Secondary | ICD-10-CM

## 2021-02-10 ENCOUNTER — Other Ambulatory Visit: Payer: Self-pay

## 2021-02-10 ENCOUNTER — Ambulatory Visit
Admission: RE | Admit: 2021-02-10 | Discharge: 2021-02-10 | Disposition: A | Discharge status: Home / Self Care | Payer: 59 | Source: Ambulatory Visit

## 2021-02-10 DIAGNOSIS — Z1231 Encounter for screening mammogram for malignant neoplasm of breast: Secondary | ICD-10-CM | POA: Diagnosis not present

## 2021-09-26 IMAGING — MG DIGITAL SCREENING BILAT W/ TOMO W/ CAD
8 series · 8 of 24 positions shown · non-contrast
Comparison: Previous exam(s).

CLINICAL DATA: Screening.

EXAM:
DIGITAL SCREENING BILATERAL MAMMOGRAM WITH TOMO AND CAD

[R CC synth-2D]
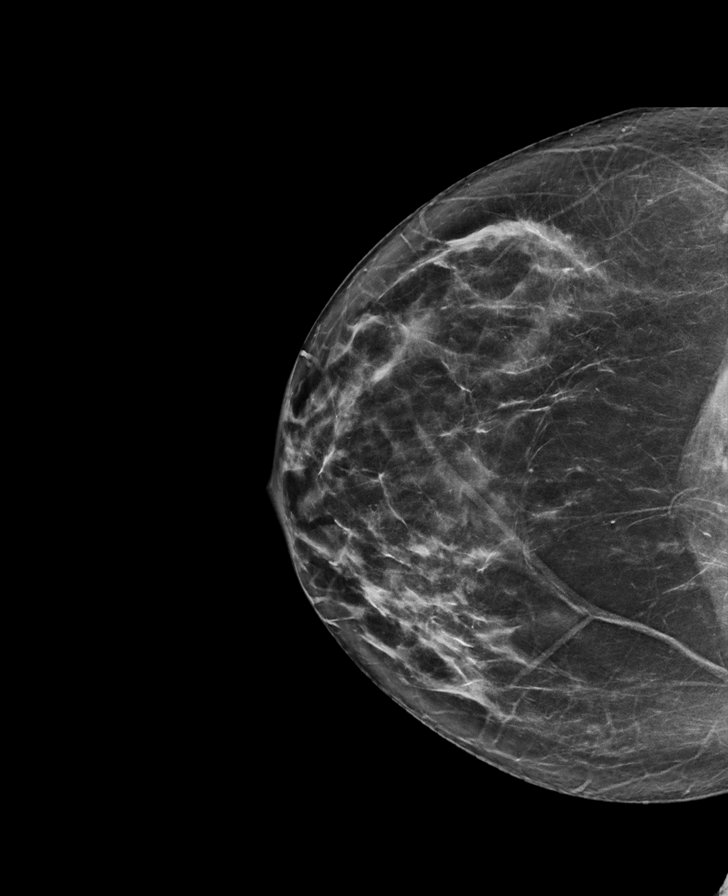

[L CC synth-2D]
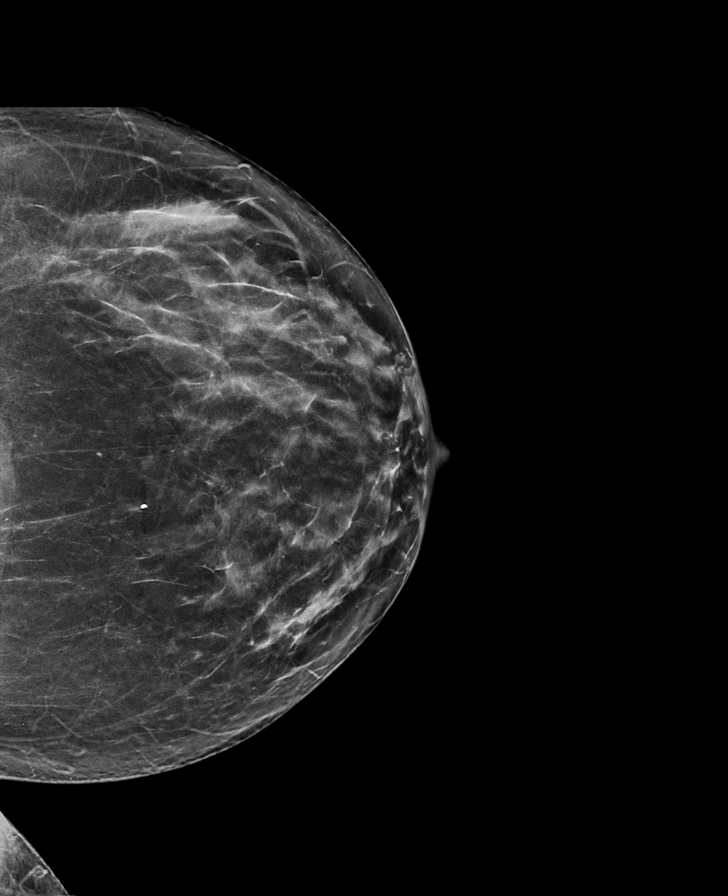

[L MLO synth-2D]
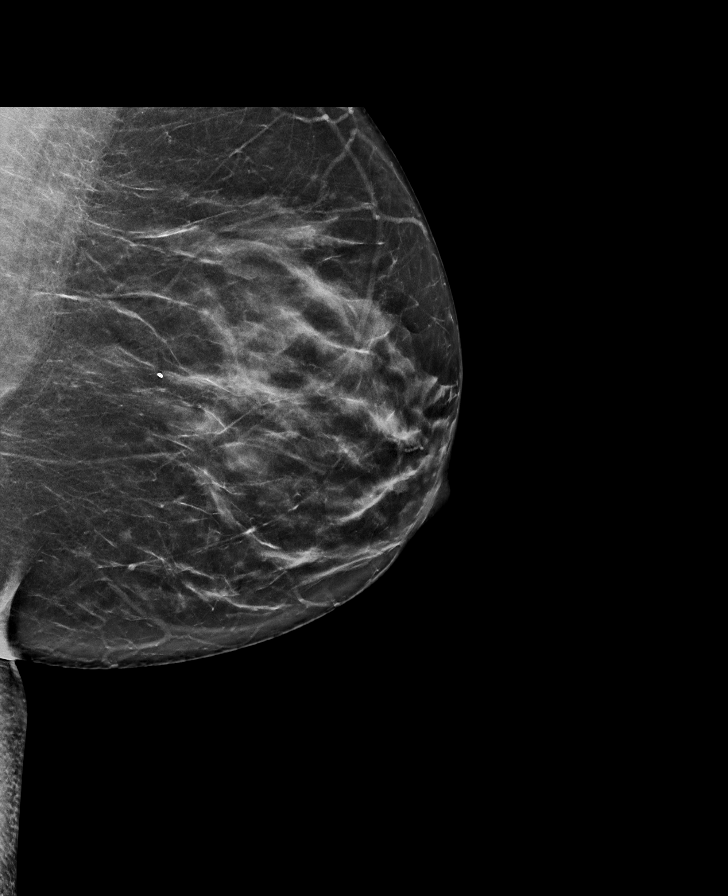

[R MLO synth-2D]
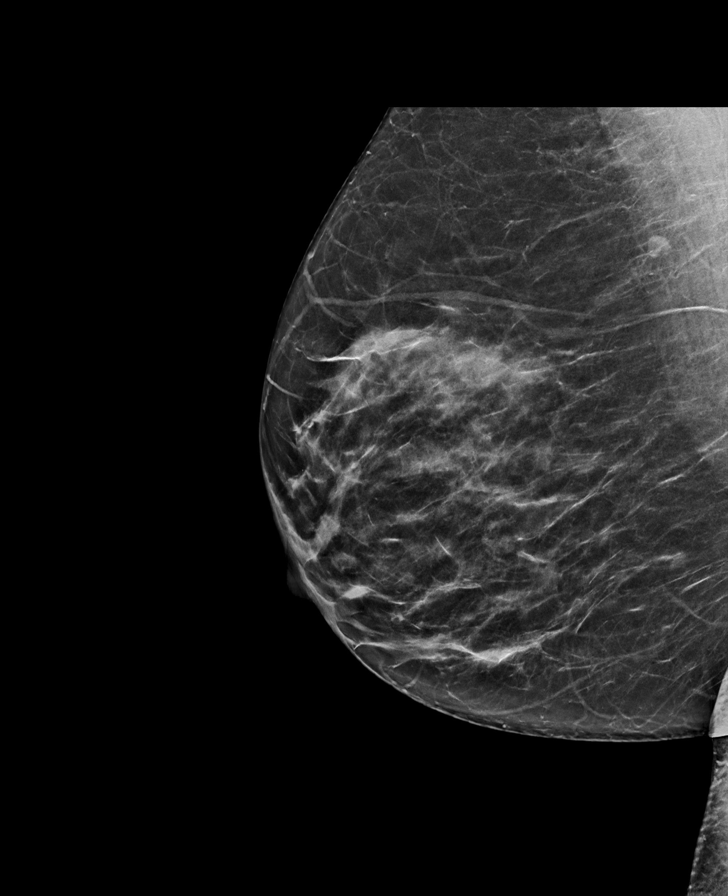

[R CC tomo · tomo slice 39/76.0]
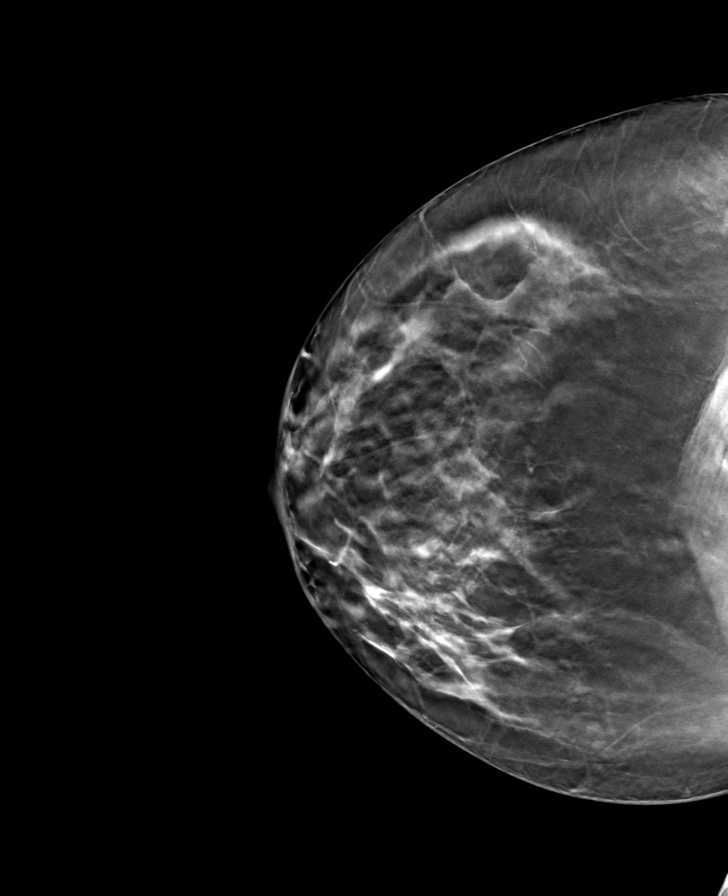

[L MLO tomo · tomo slice 37/73.0]
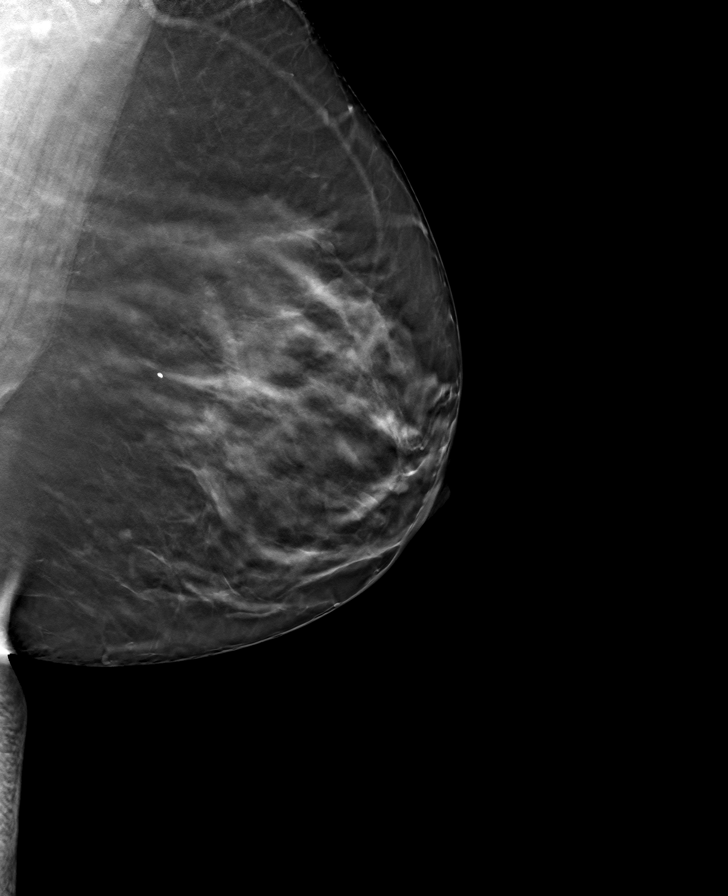

[L CC tomo · tomo slice 35/70.0]
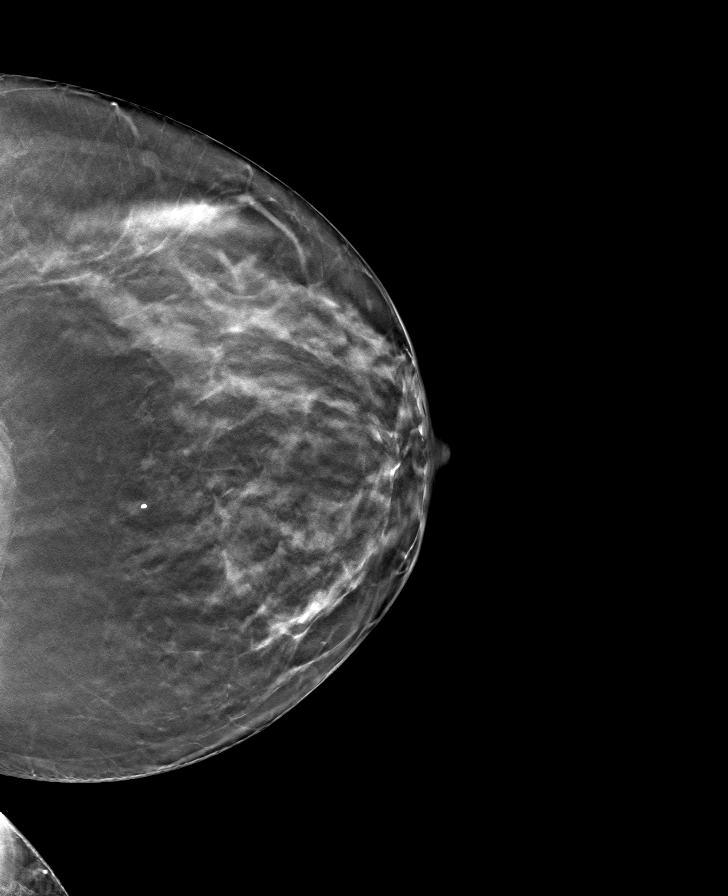

[R MLO tomo · tomo slice 35/69.0]
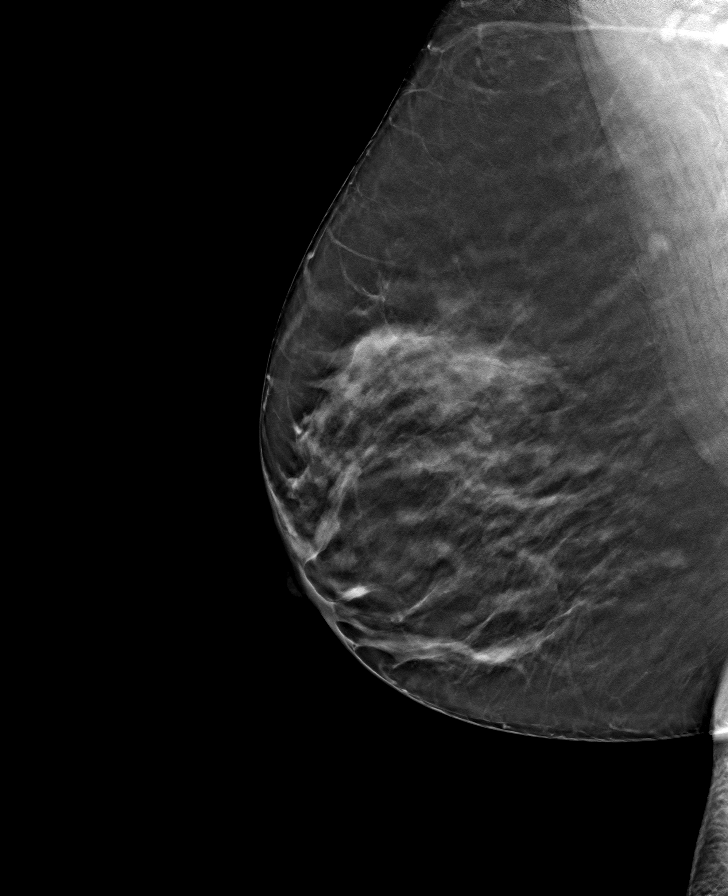

[8 of 24 positions shown; findings below may reference images not displayed]

ACR Breast Density Category c: The breast tissue is heterogeneously
dense, which may obscure small masses.
FINDINGS: There are no findings suspicious for malignancy. Images were
processed with CAD.
IMPRESSION: No mammographic evidence of malignancy. A result letter of this
screening mammogram will be mailed directly to the patient.

RECOMMENDATION:
Screening mammogram in one year. (Code:FT-U-LHB)

BI-RADS CATEGORY  1: Negative.

## 2022-03-30 ENCOUNTER — Other Ambulatory Visit: Payer: Self-pay | Admitting: Family Medicine

## 2022-03-30 DIAGNOSIS — Z1231 Encounter for screening mammogram for malignant neoplasm of breast: Secondary | ICD-10-CM

## 2022-05-25 ENCOUNTER — Ambulatory Visit
Admission: RE | Admit: 2022-05-25 | Discharge: 2022-05-25 | Disposition: A | Discharge status: Home / Self Care | Payer: BC Managed Care – PPO | Source: Ambulatory Visit

## 2022-05-25 DIAGNOSIS — Z1231 Encounter for screening mammogram for malignant neoplasm of breast: Secondary | ICD-10-CM | POA: Diagnosis not present

## 2023-06-26 NOTE — Progress Notes (Unsigned)
New Patient Office Visit  Subjective    Patient ID: Terry Chang, female    DOB: [birth year]  Age: 43 y.o. MRN: [id number]  CC: No chief complaint on file.   HPI Terry Chang presents to establish care ***  Outpatient Encounter Medications as of 06/28/2023  Medication Sig  . albuterol (VENTOLIN HFA) 108 (90 Base) MCG/ACT inhaler Inhale 2 puffs into the lungs every 6 (six) hours as needed for wheezing.  . montelukast (SINGULAIR) 10 MG tablet TAKE 1 TABLET(10 MG) BY MOUTH AT BEDTIME  . pantoprazole (PROTONIX) 40 MG tablet TAKE 1 TABLET(40 MG) BY MOUTH DAILY  . XARELTO 20 MG TABS tablet TAKE 1 TABLET BY MOUTH DAILY WITH SUPPER   No facility-administered encounter medications on file as of 06/28/2023.    Past Medical History:  Diagnosis Date  . Asthma     No past surgical history on file.  Family History  Problem Relation Age of Onset  . Alcohol abuse Father   . Breast cancer Maternal Aunt        Mothers aunt  . Prostate cancer Maternal Grandfather   . Heart disease Paternal Grandfather   . Heart attack Paternal Grandfather     Social History   Socioeconomic History  . Marital status: Single    Spouse name: Not on file  . Number of children: 2  . Years of education: Not on file  . Highest education level: Not on file  Occupational History  . Occupation: cma    Employer: [employer]  Tobacco Use  . Smoking status: Former    Current packs/day: 0.00    Average packs/day: 0.2 packs/day for 7.0 years (1.4 ttl pk-yrs)    Types: Cigarettes    Start date: 11/01/1993    Quit date: 11/01/2000    Years since quitting: 22.6  . Smokeless tobacco: Never  Substance and Sexual Activity  . Alcohol use: Yes    Comment: OCC  . Drug use: No  . Sexual activity: Yes    Partners: Male    Birth control/protection: None  Other Topics Concern  . Not on file  Social History Narrative   Exercise---  Walking and [employer]   Social Drivers of Health    Financial Resource Strain: Not on file  Food Insecurity: Not on file  Transportation Needs: Not on file  Physical Activity: Not on file  Stress: Not on file  Social Connections: Not on file  Intimate Partner Violence: Not on file    ROS Per HPI      Objective    There were no vitals taken for this visit.  Physical Exam Vitals and nursing note reviewed.  Constitutional:      Appearance: Normal appearance. She is normal weight.  HENT:     Head: Normocephalic and atraumatic.     Right Ear: Tympanic membrane and ear canal normal.     Left Ear: Tympanic membrane and ear canal normal.     Nose: Nose normal.  Eyes:     Extraocular Movements: Extraocular movements intact.     Pupils: Pupils are equal, round, and reactive to light.  Cardiovascular:     Rate and Rhythm: Normal rate and regular rhythm.     Heart sounds: Normal heart sounds.  Pulmonary:     Effort: Pulmonary effort is normal.     Breath sounds: Normal breath sounds.  Musculoskeletal:        General: Normal range of motion.     Cervical  back: Normal range of motion.  Neurological:     General: No focal deficit present.     Mental Status: She is alert and oriented to person, place, and time.  Psychiatric:        Mood and Affect: Mood normal.        Thought Content: Thought content normal.       Assessment & Plan:   There are no diagnoses linked to this encounter.   No follow-ups on file.   Moshe Cipro, FNP

## 2023-06-28 ENCOUNTER — Ambulatory Visit: Payer: BC Managed Care – PPO

## 2023-06-28 ENCOUNTER — Encounter: Payer: Self-pay | Admitting: Family Medicine

## 2023-06-28 ENCOUNTER — Ambulatory Visit: Payer: BC Managed Care – PPO | Admitting: Family Medicine

## 2023-06-28 VITALS — BP 122/98 | Temp 98.1°F | Ht 62.4 in | Wt 156.8 lb

## 2023-06-28 DIAGNOSIS — F419 Anxiety disorder, unspecified: Secondary | ICD-10-CM

## 2023-06-28 DIAGNOSIS — M5030 Other cervical disc degeneration, unspecified cervical region: Secondary | ICD-10-CM | POA: Diagnosis not present

## 2023-06-28 DIAGNOSIS — M4802 Spinal stenosis, cervical region: Secondary | ICD-10-CM | POA: Diagnosis not present

## 2023-06-28 DIAGNOSIS — E559 Vitamin D deficiency, unspecified: Secondary | ICD-10-CM

## 2023-06-28 DIAGNOSIS — R29898 Other symptoms and signs involving the musculoskeletal system: Secondary | ICD-10-CM | POA: Diagnosis not present

## 2023-06-28 DIAGNOSIS — R251 Tremor, unspecified: Secondary | ICD-10-CM

## 2023-06-28 DIAGNOSIS — K219 Gastro-esophageal reflux disease without esophagitis: Secondary | ICD-10-CM | POA: Insufficient documentation

## 2023-06-28 DIAGNOSIS — J4520 Mild intermittent asthma, uncomplicated: Secondary | ICD-10-CM

## 2023-06-28 DIAGNOSIS — M542 Cervicalgia: Secondary | ICD-10-CM

## 2023-06-28 DIAGNOSIS — M47812 Spondylosis without myelopathy or radiculopathy, cervical region: Secondary | ICD-10-CM | POA: Diagnosis not present

## 2023-06-28 LAB — VITAMIN D 25 HYDROXY (VIT D DEFICIENCY, FRACTURES): VITD: 25.43 ng/mL — ABNORMAL LOW (ref 30.00–100.00)

## 2023-06-28 LAB — CBC WITH DIFFERENTIAL/PLATELET
Basophils Absolute: 0.1 10*3/uL (ref 0.0–0.1)
Basophils Relative: 0.9 % (ref 0.0–3.0)
Eosinophils Absolute: 0.1 10*3/uL (ref 0.0–0.7)
Eosinophils Relative: 0.9 % (ref 0.0–5.0)
HCT: 41 % (ref 36.0–46.0)
Hemoglobin: 13.8 g/dL (ref 12.0–15.0)
Lymphocytes Relative: 35.5 % (ref 12.0–46.0)
Lymphs Abs: 2.6 10*3/uL (ref 0.7–4.0)
MCHC: 33.5 g/dL (ref 30.0–36.0)
MCV: 98 fL (ref 78.0–100.0)
Monocytes Absolute: 0.4 10*3/uL (ref 0.1–1.0)
Monocytes Relative: 4.9 % (ref 3.0–12.0)
Neutro Abs: 4.2 10*3/uL (ref 1.4–7.7)
Neutrophils Relative %: 57.8 % (ref 43.0–77.0)
Platelets: 435 10*3/uL — ABNORMAL HIGH (ref 150.0–400.0)
RBC: 4.19 Mil/uL (ref 3.87–5.11)
RDW: 12.1 % (ref 11.5–15.5)
WBC: 7.3 10*3/uL (ref 4.0–10.5)

## 2023-06-28 LAB — COMPREHENSIVE METABOLIC PANEL
ALT: 45 U/L — ABNORMAL HIGH (ref 0–35)
AST: 33 U/L (ref 0–37)
Albumin: 4.6 g/dL (ref 3.5–5.2)
Alkaline Phosphatase: 43 U/L (ref 39–117)
BUN: 12 mg/dL (ref 6–23)
CO2: 28 meq/L (ref 19–32)
Calcium: 9.6 mg/dL (ref 8.4–10.5)
Chloride: 101 meq/L (ref 96–112)
Creatinine, Ser: 0.98 mg/dL (ref 0.40–1.20)
GFR: 70.17 mL/min (ref >60.00)
Glucose, Bld: 93 mg/dL (ref 70–99)
Potassium: 4.1 meq/L (ref 3.5–5.1)
Sodium: 137 meq/L (ref 135–145)
Total Bilirubin: 0.8 mg/dL (ref 0.2–1.2)
Total Protein: 7.5 g/dL (ref 6.0–8.3)

## 2023-06-28 LAB — VITAMIN B12: Vitamin B-12: 457 pg/mL (ref 211–911)

## 2023-06-28 MED ORDER — ALBUTEROL SULFATE HFA 108 (90 BASE) MCG/ACT IN AERS: 2.0000 | INHALATION_SPRAY | Freq: Four times a day (QID) | RESPIRATORY_TRACT | 1 refills | Status: AC | PRN

## 2023-06-28 MED ORDER — PANTOPRAZOLE SODIUM 40 MG PO TBEC: 40.0000 mg | DELAYED_RELEASE_TABLET | Freq: Every day | ORAL | 1 refills | Status: DC

## 2023-06-28 MED ORDER — MONTELUKAST SODIUM 10 MG PO TABS: 10.0000 mg | ORAL_TABLET | Freq: Every day | ORAL | 1 refills | Status: DC

## 2023-06-28 NOTE — Patient Instructions (Signed)
Welcome to [facility]!  We are checking labs today, will be in contact with any results that require further attention  We are getting an xray today. We will be in contact with any abnormal results that require further attention.  Refilled albuterol, singulair, protonix  Follow-up with me for new or worsening symptoms.

## 2023-07-01 ENCOUNTER — Other Ambulatory Visit: Payer: Self-pay | Admitting: Family Medicine

## 2023-07-01 ENCOUNTER — Encounter: Payer: Self-pay | Admitting: Family Medicine

## 2023-07-01 DIAGNOSIS — Z0000 Encounter for general adult medical examination without abnormal findings: Secondary | ICD-10-CM

## 2023-07-01 MED ORDER — VITAMIN D (ERGOCALCIFEROL) 1.25 MG (50000 UNIT) PO CAPS: 50000.0000 [IU] | ORAL_CAPSULE | ORAL | 0 refills | Status: AC

## 2023-07-01 NOTE — Addendum Note (Signed)
 Addended by: Sherald Barge on: 07/01/2023 08:41 AM   Modules accepted: Orders

## 2023-07-10 ENCOUNTER — Ambulatory Visit
Admission: RE | Admit: 2023-07-10 | Discharge: 2023-07-10 | Disposition: A | Discharge status: Home / Self Care | Payer: BC Managed Care – PPO | Source: Ambulatory Visit | Attending: Family Medicine | Admitting: Family Medicine

## 2023-07-10 DIAGNOSIS — Z1231 Encounter for screening mammogram for malignant neoplasm of breast: Secondary | ICD-10-CM | POA: Diagnosis not present

## 2023-07-10 DIAGNOSIS — Z0000 Encounter for general adult medical examination without abnormal findings: Secondary | ICD-10-CM

## 2023-07-15 ENCOUNTER — Other Ambulatory Visit: Payer: Self-pay | Admitting: Family Medicine

## 2023-07-15 ENCOUNTER — Encounter: Payer: Self-pay | Admitting: Family Medicine

## 2023-07-15 DIAGNOSIS — R928 Other abnormal and inconclusive findings on diagnostic imaging of breast: Secondary | ICD-10-CM

## 2023-07-15 DIAGNOSIS — M542 Cervicalgia: Secondary | ICD-10-CM

## 2023-07-23 NOTE — Therapy (Unsigned)
 OUTPATIENT PHYSICAL THERAPY CERVICAL EVALUATION   Patient Name: Terry Chang MRN: [id number] DOB:08/07/1980, 43 y.o., female Today's Date: 07/25/2023  END OF SESSION:  PT End of Session - 07/24/23 1420     Visit Number 1    Number of Visits 12    Date for PT Re-Evaluation 09/04/23    Authorization Type BCBS    PT Start Time 1415    PT Stop Time 1500    PT Time Calculation (min) 45 min    Activity Tolerance Patient tolerated treatment well    Behavior During Therapy WFL for tasks assessed/performed             Past Medical History:  Diagnosis Date   Asthma    GERD (gastroesophageal reflux disease)    History reviewed. No pertinent surgical history. Patient Active Problem List   Diagnosis Date Noted   Gastroesophageal reflux disease 06/28/2023   Acute deep vein thrombosis (DVT) of tibial vein of left lower extremity (HCC) 02/18/2020   Dyspepsia 01/13/2019   Chest pain 01/13/2019   Preventative health care 12/11/2018   Anxiety 12/27/2017   Insomnia 08/20/2012   Hot flashes 08/20/2012   Asthma in adult 09/18/2011    PCP: Moshe Cipro FNP  REFERRING PROVIDER: Moshe Cipro, FNP  REFERRING DIAG: M54.2 (ICD-10-CM) - Neck pain  THERAPY DIAG:  Radiculopathy, cervical region  Cramp and spasm  Localized edema  Muscle weakness (generalized)  Rationale for Evaluation and Treatment: Rehabilitation  ONSET DATE: Dec. 2024  SUBJECTIVE:                                                                                                                                                                                                         SUBJECTIVE STATEMENT: Patient presents with neck pain, swelling and UE symptoms.  She reports pain in posterior neck L> R  which began Dec. 2024.  She then developed swelling in collarbone region. She also reports a mild trembling in arms which may have begun last fall.  The arms are not painful but does have some  numbness in finger tips L>R.   She has difficulty with reaching out , holding an item away from her body, taking out sutures.  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 The swelling in her neck is consistent but does feel a slight increase at times. She reports stiffness in her cervical spine with head movement. She has headaches but this is new 2-3 weeks. She does feel a good amount of stress, goes to sleep easily  but does wake with pain .  She does need to carry, lift moderate items at work.    Hand dominance: Right  PERTINENT HISTORY:  DVT, anxiety, whiplash   PAIN:  Are you having pain? Yes: NPRS scale: 2/10 with movement, can be 7/10 Pain location: post neck  Pain description: tight, weakness Aggravating factors: work, sitting, malposition Relieving factors: lying down a bit, rest   PRECAUTIONS: None   RED FLAGS: None     WEIGHT BEARING RESTRICTIONS: No  FALLS:  Has patient fallen in last 6 months?  No but does stumble a bit at times moving too quick   LIVING ENVIRONMENT: Lives with: lives with their partner Lives in: House/apartment Stairs:  no issues  Has following equipment at home: None  OCCUPATION: CMA Full time   PLOF: Independent, Vocation/Vocational requirements: fine motor, gripping and removing dressing, sutures, and Leisure: reading, travleling   PATIENT GOALS: To get the swelling down and get strength back.   NEXT MD VISIT: Follow up after PT.   OBJECTIVE:  Note: Objective measures were completed at Evaluation unless otherwise noted.  DIAGNOSTIC FINDINGS:  CLINICAL DATA:  neck pain, arm weakness   EXAM: CERVICAL SPINE - 3 VIEW   COMPARISON:  None Available.   FINDINGS: No fracture, dislocation or subluxation. No spondylolisthesis. No osteolytic or osteoblastic changes. Prevertebral and cervical cranial soft tissues are unremarkable.    Degenerative disc disease noted with disc space narrowing and marginal  osteophytes at C4-6.   IMPRESSION: Degenerative changes. No acute osseous abnormalities.  PATIENT SURVEYS:  NDI done on eval 13/50  COGNITION: Overall cognitive status: Within functional limits for tasks assessed  SENSATION: Occ N/T   POSTURE: rounded shoulders and forward head  PALPATION: Patient with significant tension in bilateral upper traps levator scapula a scalenes and lateral cervicals, suboccipitals  Supraclavicular edema present bilaterally.     CERVICAL ROM:   Active ROM A/PROM (deg) eval  Flexion 55  Extension 60  Right lateral flexion 46  Left lateral flexion 46  Right rotation WFL pain bilateral   Left rotation WFL more pain on L    (Blank rows = not tested)  UPPER EXTREMITY ROM: WFL   Active ROM Right eval Left eval  Shoulder flexion 4- 4-  Shoulder extension    Shoulder abduction 4- 4-  Shoulder adduction    Shoulder extension    Shoulder internal rotation    Shoulder external rotation    Elbow flexion 4+ 4+  Elbow extension 3+ 3+  Wrist flexion    Wrist extension    Wrist ulnar deviation    Wrist radial deviation    Wrist pronation    Wrist supination     (Blank rows = not tested)  UPPER EXTREMITY MMT:  MMT Right eval Left eval  Shoulder flexion    Shoulder extension    Shoulder abduction    Shoulder adduction    Shoulder extension    Shoulder internal rotation    Shoulder external rotation    Middle trapezius    Lower trapezius    Elbow flexion    Elbow extension    Wrist flexion    Wrist extension    Wrist ulnar deviation    Wrist radial deviation    Wrist pronation    Wrist supination    Grip strength     (Blank rows = not tested)  CERVICAL SPECIAL TESTS:  Neck flexor muscle endurance test: Positive and Spurling's test: Negative  TREATMENT DATE: [facility] Adult  PT Treatment:                                                DATE: 07/24/23 Manual Therapy: Suboccipital release Manual guided cervical retraction Soft tissue  mobilization left posterior lateral cervical  Therapeutic Activity: Chin tuck Scapular retraction Narrow grip overhead lift cues for maintaining head position x 10  ER red band looped x 10   Self Care: Posture, differential diagnosis, POC, home program                                                                                                   PATIENT EDUCATION:  Education details: see above  Person educated: Patient Education method: Explanation, Demonstration, and Handouts Education comprehension: verbalized understanding, returned demonstration, and needs further education  HOME EXERCISE PROGRAM: Access Code: [email or web address] URL: [email or web address] Date: 07/24/2023 Prepared by: Karie Mainland  Exercises - Supine Cervical Retraction with Towel  - 1 x daily - 7 x weekly - 2 sets - 10 reps - 5 hold - Seated Scapular Retraction  - 1 x daily - 7 x weekly - 2 sets - 10 reps - 5 hold - Shoulder Flexion Serratus Activation with Resistance  - 1 x daily - 7 x weekly - 2 sets - 10 reps - 5 hold - Shoulder External Rotation and Scapular Retraction with Resistance  - 1 x daily - 7 x weekly - 2 sets - 10 reps - 5 hold  ASSESSMENT:  CLINICAL IMPRESSION: Patient is a 43 y.o. female who was seen today for physical therapy evaluation and treatment for neck pain, weakness and supraclavicular swelling.  Patient presentation is consistent with cervical radiculopathy due to either anterior osteophytes in the cervical spine or disc involvement.  OBJECTIVE IMPAIRMENTS: decreased mobility, decreased ROM, decreased strength, increased edema, increased fascial restrictions, impaired flexibility, impaired UE functional use, postural dysfunction, and pain.   ACTIVITY LIMITATIONS: carrying, lifting, sitting, and caring for others  PARTICIPATION LIMITATIONS: interpersonal relationship, community activity, and occupation  PERSONAL FACTORS: 1-2 comorbidities: Anxiety, nature of her work  are  also affecting patient's functional outcome.   REHAB POTENTIAL: Excellent  CLINICAL DECISION MAKING: Evolving/moderate complexity  EVALUATION COMPLEXITY: Moderate   GOALS: Goals reviewed with patient? Yes  SHORT TERM GOALS: Target date: 08/08/2023    Patient will be independent with home exercise program for cervical stabilization Baseline:  Goal status: INITIAL  2.  Patient will take breaks to correct her posture throughout the day Baseline:  Goal status: INITIAL   LONG TERM GOALS: Target date: 09/05/2023    Patient will improve her NDI score to 8/50 Baseline: 13/50 Goal status: INITIAL  2.  Patient will be independent with final home program Baseline:  Goal status: INITIAL  3.  Patient will be able to report resolution of sensory symptoms in upper extremities Baseline:  Goal status: INITIAL  4.  Patient will report better control and hand manipulation with work activities Baseline:  Goal status: INITIAL  5.  Pt will be I with lifting techniques without increased neck pain  Baseline:  Goal status: INITIAL    PLAN:  PT FREQUENCY: 2x/week  PT DURATION: 8 weeks  PLANNED INTERVENTIONS: 97164- PT Re-evaluation, 97110-Therapeutic exercises, 97530- Therapeutic activity, 97112- Neuromuscular re-education, 97535- Self Care, 16109- Manual therapy, 97012- Traction (mechanical), Patient/Family education, Taping, Dry Needling, Spinal mobilization, Cryotherapy, and Moist heat  PLAN FOR NEXT SESSION: check HEP, manual therapy, DN to cervicals , posture, stability    Tashira Torre, PT 07/25/2023, 11:04 AM

## 2023-07-24 ENCOUNTER — Ambulatory Visit: Attending: Family Medicine | Admitting: Physical Therapy

## 2023-07-24 ENCOUNTER — Ambulatory Visit

## 2023-07-24 ENCOUNTER — Ambulatory Visit
Admission: RE | Admit: 2023-07-24 | Discharge: 2023-07-24 | Disposition: A | Discharge status: Home / Self Care | Source: Ambulatory Visit | Attending: Family Medicine | Admitting: Family Medicine

## 2023-07-24 DIAGNOSIS — R600 Localized edema: Secondary | ICD-10-CM | POA: Diagnosis not present

## 2023-07-24 DIAGNOSIS — R928 Other abnormal and inconclusive findings on diagnostic imaging of breast: Secondary | ICD-10-CM | POA: Diagnosis not present

## 2023-07-24 DIAGNOSIS — M542 Cervicalgia: Secondary | ICD-10-CM | POA: Diagnosis not present

## 2023-07-24 DIAGNOSIS — R252 Cramp and spasm: Secondary | ICD-10-CM | POA: Insufficient documentation

## 2023-07-24 DIAGNOSIS — M6281 Muscle weakness (generalized): Secondary | ICD-10-CM | POA: Insufficient documentation

## 2023-07-24 DIAGNOSIS — M5412 Radiculopathy, cervical region: Secondary | ICD-10-CM | POA: Insufficient documentation

## 2023-07-25 ENCOUNTER — Encounter: Payer: Self-pay | Admitting: Physical Therapy

## 2023-07-30 ENCOUNTER — Encounter: Payer: Self-pay | Admitting: Physical Therapy

## 2023-07-30 ENCOUNTER — Ambulatory Visit: Admitting: Physical Therapy

## 2023-07-30 DIAGNOSIS — M542 Cervicalgia: Secondary | ICD-10-CM | POA: Diagnosis not present

## 2023-07-30 DIAGNOSIS — M5412 Radiculopathy, cervical region: Secondary | ICD-10-CM | POA: Diagnosis not present

## 2023-07-30 DIAGNOSIS — R252 Cramp and spasm: Secondary | ICD-10-CM

## 2023-07-30 DIAGNOSIS — R600 Localized edema: Secondary | ICD-10-CM | POA: Diagnosis not present

## 2023-07-30 DIAGNOSIS — M6281 Muscle weakness (generalized): Secondary | ICD-10-CM | POA: Diagnosis not present

## 2023-07-30 NOTE — Therapy (Signed)
 OUTPATIENT PHYSICAL THERAPY CERVICAL TREATMENT   Patient Name: Terry Chang MRN: [id number] DOB:06-Jan-1981, 43 y.o., female Today's Date: 07/30/2023  END OF SESSION:  PT End of Session - 07/30/23 1448     Visit Number 2    Number of Visits 16    Date for PT Re-Evaluation 09/19/23    Authorization Type BCBS    PT Start Time 0245    PT Stop Time 0325    PT Time Calculation (min) 40 min             Past Medical History:  Diagnosis Date   Asthma    GERD (gastroesophageal reflux disease)    History reviewed. No pertinent surgical history. Patient Active Problem List   Diagnosis Date Noted   Gastroesophageal reflux disease 06/28/2023   Acute deep vein thrombosis (DVT) of tibial vein of left lower extremity (HCC) 02/18/2020   Dyspepsia 01/13/2019   Chest pain 01/13/2019   Preventative health care 12/11/2018   Anxiety 12/27/2017   Insomnia 08/20/2012   Hot flashes 08/20/2012   Asthma in adult 09/18/2011    PCP: Moshe Cipro FNP  REFERRING PROVIDER: Moshe Cipro, FNP  REFERRING DIAG: M54.2 (ICD-10-CM) - Neck pain  THERAPY DIAG:  Radiculopathy, cervical region  Cramp and spasm  Rationale for Evaluation and Treatment: Rehabilitation  ONSET DATE: Dec. 2024  SUBJECTIVE:                                                                                                                                                                                                         SUBJECTIVE STATEMENT: 3/10 pain on arrival. No HEP, last 3 days.    EVAL: Patient presents with neck pain, swelling and UE symptoms.  She reports pain in posterior neck L> R  which began Dec. 2024.  She then developed swelling in collarbone region. She also reports a mild trembling in arms which may have begun last fall.  The arms are not painful but does have some numbness in finger tips L>R.   She has difficulty with reaching out , holding an item away from her body, taking out  sutures.  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 The swelling in her neck is consistent but does feel a slight increase at times. She reports stiffness in her cervical spine with head movement. She has headaches but this is new 2-3 weeks. She does feel a good amount of stress, goes to sleep easily but does wake with pain .  She does need to carry, lift  moderate items at work.    Hand dominance: Right  PERTINENT HISTORY:  DVT, anxiety, whiplash   PAIN:  Are you having pain? Yes: NPRS scale: 3/10  Pain location: post neck , upper back  Pain description: tight, weakness Aggravating factors: work, sitting, malposition Relieving factors: lying down a bit, rest   PRECAUTIONS: None   RED FLAGS: None     WEIGHT BEARING RESTRICTIONS: No  FALLS:  Has patient fallen in last 6 months?  No but does stumble a bit at times moving too quick   LIVING ENVIRONMENT: Lives with: lives with their partner Lives in: House/apartment Stairs:  no issues  Has following equipment at home: None  OCCUPATION: CMA Full time   PLOF: Independent, Vocation/Vocational requirements: fine motor, gripping and removing dressing, sutures, and Leisure: reading, travleling   PATIENT GOALS: To get the swelling down and get strength back.   NEXT MD VISIT: Follow up after PT.   OBJECTIVE:  Note: Objective measures were completed at Evaluation unless otherwise noted.  DIAGNOSTIC FINDINGS:  CLINICAL DATA:  neck pain, arm weakness   EXAM: CERVICAL SPINE - 3 VIEW   COMPARISON:  None Available.   FINDINGS: No fracture, dislocation or subluxation. No spondylolisthesis. No osteolytic or osteoblastic changes. Prevertebral and cervical cranial soft tissues are unremarkable.    Degenerative disc disease noted with disc space narrowing and marginal osteophytes at C4-6.   IMPRESSION: Degenerative changes. No acute osseous abnormalities.  PATIENT SURVEYS:  NDI done on eval  13/50  COGNITION: Overall cognitive status: Within functional limits for tasks assessed  SENSATION: Occ N/T   POSTURE: rounded shoulders and forward head  PALPATION: Patient with significant tension in bilateral upper traps levator scapula a scalenes and lateral cervicals, suboccipitals  Supraclavicular edema present bilaterally.     CERVICAL ROM:   Active ROM A/PROM (deg) eval  Flexion 55  Extension 60  Right lateral flexion 46  Left lateral flexion 46  Right rotation WFL pain bilateral   Left rotation WFL more pain on L    (Blank rows = not tested)  UPPER EXTREMITY ROM: WFL   Active ROM Right eval Left eval  Shoulder flexion 4- 4-  Shoulder extension    Shoulder abduction 4- 4-  Shoulder adduction    Shoulder extension    Shoulder internal rotation    Shoulder external rotation    Elbow flexion 4+ 4+  Elbow extension 3+ 3+  Wrist flexion    Wrist extension    Wrist ulnar deviation    Wrist radial deviation    Wrist pronation    Wrist supination     (Blank rows = not tested)  UPPER EXTREMITY MMT:  MMT Right eval Left eval  Shoulder flexion    Shoulder extension    Shoulder abduction    Shoulder adduction    Shoulder extension    Shoulder internal rotation    Shoulder external rotation    Middle trapezius    Lower trapezius    Elbow flexion    Elbow extension    Wrist flexion    Wrist extension    Wrist ulnar deviation    Wrist radial deviation    Wrist pronation    Wrist supination    Grip strength     (Blank rows = not tested)  CERVICAL SPECIAL TESTS:  Neck flexor muscle endurance test: Positive and Spurling's test: Negative DNF endurance: 41 sec on 07/30/23  [facility] Adult PT Treatment:  DATE: 07/30/23 Therapeutic Activity: Scap retract x 10  Seated chin tuck  Seated shoulder ER bilat 10 x 2  RTB Levator stretch AROM - stretch but pain on opp side so disc.  Supine serratus activation with  resistance 5 x 4;  looped at wrists RTB Supine horizontal abduction red band 10 x 2 +HEP Chin tuck supine over towel  Supine red band diagonals 5 x 2 each with chin tucked +HEP DNF endurance 41 sec HEP updated     TREATMENT DATE: [facility] Adult PT Treatment:                                                DATE: 07/24/23 Manual Therapy: Suboccipital release Manual guided cervical retraction Soft tissue mobilization left posterior lateral cervical  Therapeutic Activity: Chin tuck Scapular retraction Narrow grip overhead lift cues for maintaining head position x 10  ER red band looped x 10   Self Care: Posture, differential diagnosis, POC, home program                                                                                                   PATIENT EDUCATION:  Education details: see above  Person educated: Patient Education method: Explanation, Demonstration, and Handouts Education comprehension: verbalized understanding, returned demonstration, and needs further education  HOME EXERCISE PROGRAM: Access Code: [email or web address] URL: [email or web address] Date: 07/24/2023 Prepared by: Karie Mainland  Exercises - Supine Cervical Retraction with Towel  - 1 x daily - 7 x weekly - 2 sets - 10 reps - 5 hold - Seated Scapular Retraction  - 1 x daily - 7 x weekly - 2 sets - 10 reps - 5 hold SUPINE- Shoulder Flexion Serratus Activation with Resistance  - 1 x daily - 7 x weekly - 2 sets - 10 reps - 5 hold SUPINE- Shoulder External Rotation and Scapular Retraction with Resistance  - 1 x daily - 7 x weekly - 2 sets - 10 reps - 5 hold Added SUPINE- Alternating star pattern  - 1 x daily - 7 x weekly - 2 sets - 10 reps SUPINE- Standing Shoulder Horizontal Abduction with Resistance  - 1 x daily - 7 x weekly - 2 sets - 10 reps  ASSESSMENT:  CLINICAL IMPRESSION: Pt with 3/10 pain on arrival. Reviewed HEP and progressed in supine for shoulder/scap/cervical stab for postural endurance.  Updated HEP. Pt felt looser after session. Cues provided. Most therex performed in Supine and will progress as tolerated. Pt interested in TPDN. She does have suprapclavicular swelling.    EVAL: Patient is a 43 y.o. female who was seen today for physical therapy evaluation and treatment for neck pain, weakness and supraclavicular swelling.  Patient presentation is consistent with cervical radiculopathy due to either anterior osteophytes in the cervical spine or disc involvement.  OBJECTIVE IMPAIRMENTS: decreased mobility, decreased ROM, decreased strength, increased edema, increased fascial restrictions, impaired flexibility, impaired UE functional use, postural dysfunction, and pain.   ACTIVITY LIMITATIONS:  carrying, lifting, sitting, and caring for others  PARTICIPATION LIMITATIONS: interpersonal relationship, community activity, and occupation  PERSONAL FACTORS: 1-2 comorbidities: Anxiety, nature of her work  are also affecting patient's functional outcome.   REHAB POTENTIAL: Excellent  CLINICAL DECISION MAKING: Evolving/moderate complexity  EVALUATION COMPLEXITY: Moderate   GOALS: Goals reviewed with patient? Yes  SHORT TERM GOALS: Target date: 08/08/2023    Patient will be independent with home exercise program for cervical stabilization Baseline:  Goal status: INITIAL  2.  Patient will take breaks to correct her posture throughout the day Baseline:  Goal status: INITIAL   LONG TERM GOALS: Target date: 09/05/2023    Patient will improve her NDI score to 8/50 Baseline: 13/50 Goal status: INITIAL  2.  Patient will be independent with final home program Baseline:  Goal status: INITIAL  3.  Patient will be able to report resolution of sensory symptoms in upper extremities Baseline:  Goal status: INITIAL  4.  Patient will report better control and hand manipulation with work activities Baseline:  Goal status: INITIAL  5.  Pt will be I with lifting techniques  without increased neck pain  Baseline:  Goal status: INITIAL    PLAN:  PT FREQUENCY: 2x/week  PT DURATION: 8 weeks  PLANNED INTERVENTIONS: 97164- PT Re-evaluation, 97110-Therapeutic exercises, 97530- Therapeutic activity, 97112- Neuromuscular re-education, 97535- Self Care, 56213- Manual therapy, 97012- Traction (mechanical), Patient/Family education, Taping, Dry Needling, Spinal mobilization, Cryotherapy, and Moist heat  PLAN FOR NEXT SESSION: check HEP, manual therapy, DN to cervicals , posture, stability    Jannette Spanner, PTA 07/30/23 3:41 PM Phone: [phone] Fax: [phone]
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
## 2023-08-02 ENCOUNTER — Ambulatory Visit

## 2023-08-02 DIAGNOSIS — M5412 Radiculopathy, cervical region: Secondary | ICD-10-CM | POA: Diagnosis not present

## 2023-08-02 DIAGNOSIS — R252 Cramp and spasm: Secondary | ICD-10-CM

## 2023-08-02 DIAGNOSIS — M542 Cervicalgia: Secondary | ICD-10-CM | POA: Diagnosis not present

## 2023-08-02 DIAGNOSIS — M6281 Muscle weakness (generalized): Secondary | ICD-10-CM | POA: Diagnosis not present

## 2023-08-02 DIAGNOSIS — R600 Localized edema: Secondary | ICD-10-CM | POA: Diagnosis not present

## 2023-08-02 NOTE — Patient Instructions (Signed)

## 2023-08-06 ENCOUNTER — Ambulatory Visit

## 2023-08-08 ENCOUNTER — Ambulatory Visit

## 2023-08-08 DIAGNOSIS — R252 Cramp and spasm: Secondary | ICD-10-CM | POA: Diagnosis not present

## 2023-08-08 DIAGNOSIS — M6281 Muscle weakness (generalized): Secondary | ICD-10-CM | POA: Diagnosis not present

## 2023-08-08 DIAGNOSIS — M5412 Radiculopathy, cervical region: Secondary | ICD-10-CM | POA: Diagnosis not present

## 2023-08-08 DIAGNOSIS — R600 Localized edema: Secondary | ICD-10-CM | POA: Diagnosis not present

## 2023-08-08 DIAGNOSIS — M542 Cervicalgia: Secondary | ICD-10-CM | POA: Diagnosis not present

## 2023-08-08 NOTE — Therapy (Signed)
 OUTPATIENT PHYSICAL THERAPY CERVICAL TREATMENT   Patient Name: Terry Chang MRN: [id number] DOB:[birth year], 43 y.o., female Today's Date: 08/08/2023  END OF SESSION:  PT End of Session - 08/08/23 1627     Visit Number 4    Number of Visits 16    Date for PT Re-Evaluation 09/19/23    Authorization Type [employer]    PT Start Time 1550    PT Stop Time 1635    PT Time Calculation (min) 45 min    Activity Tolerance Patient tolerated treatment well    Behavior During Therapy WFL for tasks assessed/performed               Past Medical History:  Diagnosis Date   Asthma    GERD (gastroesophageal reflux disease)    History reviewed. No pertinent surgical history. Patient Active Problem List   Diagnosis Date Noted   Gastroesophageal reflux disease 06/28/2023   Acute deep vein thrombosis (DVT) of tibial vein of left lower extremity (HCC) 02/18/2020   Dyspepsia 01/13/2019   Chest pain 01/13/2019   Preventative health care 12/11/2018   Anxiety 12/27/2017   Insomnia 08/20/2012   Hot flashes 08/20/2012   Asthma in adult 09/18/2011    PCP: Moshe Cipro FNP  REFERRING PROVIDER: Moshe Cipro, FNP  REFERRING DIAG: M54.2 (ICD-10-CM) - Neck pain  THERAPY DIAG:  Radiculopathy, cervical region  Cramp and spasm  Rationale for Evaluation and Treatment: Rehabilitation  ONSET DATE: Dec. 2024  SUBJECTIVE:                                                                                                                                                                                                         SUBJECTIVE STATEMENT: Yesterday and today my neck has been painful, mostly midline of my neck. After the TPDN last week she reports feeling less tension and pain with her upper shoulders and neck.  EVAL: Patient presents with neck pain, swelling and UE symptoms.  She reports pain in posterior neck L> R  which began Dec. 2024.  She then developed swelling in  collarbone region. She also reports a mild trembling in arms which may have begun last fall.  The arms are not painful but does have some numbness in finger tips L>R.   She has difficulty with reaching out , holding an item away from her body, taking out sutures.  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 The swelling in her neck is consistent but does feel a slight increase at times. She reports stiffness  in her cervical spine with head movement. She has headaches but this is new 2-3 weeks. She does feel a good amount of stress, goes to sleep easily but does wake with pain .  She does need to carry, lift moderate items at work.    Hand dominance: Right  PERTINENT HISTORY:  DVT, anxiety, whiplash   PAIN:  Are you having pain? Yes: NPRS scale: 5/10  Pain location: post neck , upper back  Pain description: tight, weakness Aggravating factors: work, sitting, malposition Relieving factors: lying down a bit, rest   PRECAUTIONS: None   RED FLAGS: None     WEIGHT BEARING RESTRICTIONS: No  FALLS:  Has patient fallen in last 6 months?  No but does stumble a bit at times moving too quick   LIVING ENVIRONMENT: Lives with: lives with their partner Lives in: House/apartment Stairs:  no issues  Has following equipment at home: None  OCCUPATION: CMA Full time   PLOF: Independent, Vocation/Vocational requirements: fine motor, gripping and removing dressing, sutures, and Leisure: reading, travleling   PATIENT GOALS: To get the swelling down and get strength back.   NEXT MD VISIT: Follow up after PT.   OBJECTIVE:  Note: Objective measures were completed at Evaluation unless otherwise noted.  DIAGNOSTIC FINDINGS:  CLINICAL DATA:  neck pain, arm weakness   EXAM: CERVICAL SPINE - 3 VIEW   COMPARISON:  None Available.   FINDINGS: No fracture, dislocation or subluxation. No spondylolisthesis. No osteolytic or osteoblastic changes. Prevertebral and  cervical cranial soft tissues are unremarkable.    Degenerative disc disease noted with disc space narrowing and marginal osteophytes at C4-6.   IMPRESSION: Degenerative changes. No acute osseous abnormalities.  PATIENT SURVEYS:  NDI done on eval 13/50  COGNITION: Overall cognitive status: Within functional limits for tasks assessed  SENSATION: Occ N/T   POSTURE: rounded shoulders and forward head  PALPATION: Patient with significant tension in bilateral upper traps levator scapula a scalenes and lateral cervicals, suboccipitals  Supraclavicular edema present bilaterally.     CERVICAL ROM:   Active ROM A/PROM (deg) eval  Flexion 55  Extension 60  Right lateral flexion 46  Left lateral flexion 46  Right rotation WFL pain bilateral   Left rotation WFL more pain on L    (Blank rows = not tested)  UPPER EXTREMITY ROM: WFL   Active ROM Right eval Left eval  Shoulder flexion 4- 4-  Shoulder extension    Shoulder abduction 4- 4-  Shoulder adduction    Shoulder extension    Shoulder internal rotation    Shoulder external rotation    Elbow flexion 4+ 4+  Elbow extension 3+ 3+  Wrist flexion    Wrist extension    Wrist ulnar deviation    Wrist radial deviation    Wrist pronation    Wrist supination     (Blank rows = not tested)  UPPER EXTREMITY MMT:  MMT Right eval Left eval  Shoulder flexion    Shoulder extension    Shoulder abduction    Shoulder adduction    Shoulder extension    Shoulder internal rotation    Shoulder external rotation    Middle trapezius    Lower trapezius    Elbow flexion    Elbow extension    Wrist flexion    Wrist extension    Wrist ulnar deviation    Wrist radial deviation    Wrist pronation    Wrist supination    Grip strength     (  Blank rows = not tested)  CERVICAL SPECIAL TESTS:  Neck flexor muscle endurance test: Positive and Spurling's test: Negative DNF endurance: 41 sec on 07/30/23  [facility] Adult PT Treatment:                                                 DATE: 08/08/23 Self Care: Pt Ed for Proper posture and computer set up Manual Therapy: STM to bilat upper trap and cervical paraspinals Cervical traction Skilled palpation to identify TrPs  and taut muscle bands Trigger Point Dry Needling Subsequent Treatment: Instructions reviewed, if requested by the patient, prior to subsequent dry needling treatment.  Patient Verbal Consent Given: Yes Education Handout Provided: Previously Provided Muscles Treated: Bilat cervical paraspinals C3 and C5 Electrical Stimulation Performed: No Treatment Response/Outcome: Muscle twitches Therapeutic Exercise: Supine chin tuck x10 3' Supine lift offs x10 5" Supine shoulder ER bilat x15 GTB Supine shoulder abd x15 GTB Upper trap stretch x1 15" each Alt chest press x15 5# 90d pec doorway stretch x2 15" each Side bending c chin tuck x1 15" each Cervical Rotation x1 15" each Cervical Rotation c chin tuck x1 15" each Scapula retractions x15 Updated HEP  [facility] Adult PT Treatment:                                                DATE: 08/02/23 Therapeutic Exercise: Supine chin tuck x10 3' Supine lift offs x10 5" Supine shoulder ER bilat x15 GTB Supine shoulder abd x15 GTB Upper trap stretch x3 15" each Ant scalene stretch x3 15" each 90d pec doorway stretch x2 15" Updated HEP Manual Therapy: STM to bilat upper trap and cervical paraspinals Cervical traction Skilled palpation to identify TrPs  and taut muscle bands Trigger Point Dry Needling Initial Treatment: Pt instructed on Dry Needling rational, procedures, and possible side effects. Pt instructed to expect mild to moderate muscle soreness later in the day and/or into the next day.  Pt instructed in methods to reduce muscle soreness. Pt instructed to continue prescribed HEP. Patient was educated on signs and symptoms of infection and other risk factors and advised to seek medical attention should they  occur.  Patient verbalized understanding of these instructions and education.  Patient Verbal Consent Given: Yes Education Handout Provided: Yes Muscles Treated: Bilat upper traps Electrical Stimulation Performed: No Treatment Response/Outcome: muscle twitch, muscle ache   [facility] Adult PT Treatment:                                                DATE: 07/30/23 Therapeutic Activity: Scap retract x 10  Seated chin tuck  Seated shoulder ER bilat 10 x 2  RTB Levator stretch AROM - stretch but pain on opp side so disc.  Supine serratus activation with resistance 5 x 4;  looped at wrists RTB Supine horizontal abduction red band 10 x 2 +HEP Chin tuck supine over towel  Supine red band diagonals 5 x 2 each with chin tucked +HEP DNF endurance 41 sec HEP updated  PATIENT EDUCATION:  Education details: see above  Person educated: Patient Education method: Explanation, Demonstration, and Handouts Education comprehension: verbalized understanding, returned demonstration, and needs further education  HOME EXERCISE PROGRAM: Access Code: [email or web address] URL: [email or web address] Date: 08/08/2023 Prepared by: Joellyn Rued  Exercises - Supine Cervical Retraction with Towel  - 1 x daily - 7 x weekly - 2 sets - 10 reps - 5 hold - Supine Deep Neck Flexor Training - Repetitions  - 1 x daily - 7 x weekly - 3 sets - 10 reps - Seated Scapular Retraction  - 1 x daily - 7 x weekly - 2 sets - 10 reps - 5 hold - Shoulder Flexion Serratus Activation with Resistance  - 1 x daily - 7 x weekly - 2 sets - 10 reps - 5 hold - Shoulder External Rotation and Scapular Retraction with Resistance  - 1 x daily - 7 x weekly - 2 sets - 10 reps - 5 hold - Alternating star pattern  - 1 x daily - 7 x weekly - 2 sets - 10 reps - Standing Shoulder Horizontal Abduction with Resistance  - 1 x daily - 7 x weekly - 2 sets - 10 reps -  Seated Cervical Sidebending Stretch  - 2 x daily - 7 x weekly - 1 sets - 3 reps - 15 hold - Seated Scalenes Stretch  - 2 x daily - 7 x weekly - 1 sets - 3 reps - 15 hold - Doorway Pec Stretch at 90 Degrees Abduction  - 2 x daily - 7 x weekly - 1 sets - 3 reps - 15 hold - Standing Cervical Rotation AROM with Overpressure  - 1 x daily - 7 x weekly - 1 sets - 3 reps - 15 hold - Seated Cervical Retraction and Rotation  - 1 x daily - 7 x weekly - 1 sets - 3 reps - 15 hold - Standing Cervical Retraction with Sidebending  - 1 x daily - 7 x weekly - 1 sets - 3 reps - 15 hold  ASSESSMENT:  CLINICAL IMPRESSION: PT was completed for manual therapy to the neck and upper shoulders bilat f/b TPDN to the bilat cervical paraspinals with muscle twitch responses elicited. Pt then completed therex for cervical mobiliy, postural and posterior chain strengthening, and neck and pectoral stretching. At the of today's session, pt reported a decrease in her neck pain to 1-2/10 from 5/10. Additionally, provided self care fro proper posture and computer set up to minimize neck strain. Pt tolerated PT today without adverse effects.    EVAL: Patient is a 43 y.o. female who was seen today for physical therapy evaluation and treatment for neck pain, weakness and supraclavicular swelling.  Patient presentation is consistent with cervical radiculopathy due to either anterior osteophytes in the cervical spine or disc involvement.  OBJECTIVE IMPAIRMENTS: decreased mobility, decreased ROM, decreased strength, increased edema, increased fascial restrictions, impaired flexibility, impaired UE functional use, postural dysfunction, and pain.   ACTIVITY LIMITATIONS: carrying, lifting, sitting, and caring for others  PARTICIPATION LIMITATIONS: interpersonal relationship, community activity, and occupation  PERSONAL FACTORS: 1-2 comorbidities: Anxiety, nature of her work  are also affecting patient's functional outcome.   REHAB  POTENTIAL: Excellent  CLINICAL DECISION MAKING: Evolving/moderate complexity  EVALUATION COMPLEXITY: Moderate   GOALS: Goals reviewed with patient? Yes  SHORT TERM GOALS: Target date: 08/08/2023    Patient will be independent with home exercise program for cervical stabilization Baseline:  Goal status: INITIAL  2.  Patient will  take breaks to correct her posture throughout the day Baseline:  Goal status: INITIAL   LONG TERM GOALS: Target date: 09/05/2023    Patient will improve her NDI score to 8/50 Baseline: 13/50 Goal status: INITIAL  2.  Patient will be independent with final home program Baseline:  Goal status: INITIAL  3.  Patient will be able to report resolution of sensory symptoms in upper extremities Baseline:  Goal status: INITIAL  4.  Patient will report better control and hand manipulation with work activities Baseline:  Goal status: INITIAL  5.  Pt will be I with lifting techniques without increased neck pain  Baseline:  Goal status: INITIAL    PLAN:  PT FREQUENCY: 2x/week  PT DURATION: 8 weeks  PLANNED INTERVENTIONS: 97164- PT Re-evaluation, 97110-Therapeutic exercises, 97530- Therapeutic activity, 97112- Neuromuscular re-education, 97535- Self Care, 16109- Manual therapy, 97012- Traction (mechanical), Patient/Family education, Taping, Dry Needling, Spinal mobilization, Cryotherapy, and Moist heat  PLAN FOR NEXT SESSION: check HEP, manual therapy, DN to cervicals , posture, stability    Mary Hockey MS, PT 08/08/23 6:12 PM

## 2023-08-29 ENCOUNTER — Other Ambulatory Visit: Payer: Self-pay | Admitting: Family Medicine

## 2023-08-29 DIAGNOSIS — E559 Vitamin D deficiency, unspecified: Secondary | ICD-10-CM

## 2023-10-03 ENCOUNTER — Encounter: Payer: Self-pay | Admitting: Family Medicine

## 2023-10-09 ENCOUNTER — Encounter: Payer: Self-pay | Admitting: Family Medicine

## 2023-10-09 ENCOUNTER — Ambulatory Visit: Admitting: Family Medicine

## 2023-10-09 VITALS — BP 128/84 | HR 71 | Temp 98.3°F | Ht 62.4 in | Wt 160.0 lb

## 2023-10-09 DIAGNOSIS — R61 Generalized hyperhidrosis: Secondary | ICD-10-CM | POA: Diagnosis not present

## 2023-10-09 DIAGNOSIS — R591 Generalized enlarged lymph nodes: Secondary | ICD-10-CM | POA: Diagnosis not present

## 2023-10-09 LAB — CBC WITH DIFFERENTIAL/PLATELET
Basophils Absolute: 0 10*3/uL (ref 0.0–0.1)
Basophils Relative: 0.5 % (ref 0.0–3.0)
Eosinophils Absolute: 0.1 10*3/uL (ref 0.0–0.7)
Eosinophils Relative: 1.2 % (ref 0.0–5.0)
HCT: 38.4 % (ref 36.0–46.0)
Hemoglobin: 12.9 g/dL (ref 12.0–15.0)
Lymphocytes Relative: 41.7 % (ref 12.0–46.0)
Lymphs Abs: 2.3 10*3/uL (ref 0.7–4.0)
MCHC: 33.6 g/dL (ref 30.0–36.0)
MCV: 96.1 fl (ref 78.0–100.0)
Monocytes Absolute: 0.4 10*3/uL (ref 0.1–1.0)
Monocytes Relative: 6.5 % (ref 3.0–12.0)
Neutro Abs: 2.8 10*3/uL (ref 1.4–7.7)
Neutrophils Relative %: 50.1 % (ref 43.0–77.0)
Platelets: 321 10*3/uL (ref 150.0–400.0)
RBC: 4 Mil/uL (ref 3.87–5.11)
RDW: 12.3 % (ref 11.5–15.5)
WBC: 5.6 10*3/uL (ref 4.0–10.5)

## 2023-10-09 LAB — COMPREHENSIVE METABOLIC PANEL WITH GFR
ALT: 82 U/L — ABNORMAL HIGH (ref 0–35)
AST: 40 U/L — ABNORMAL HIGH (ref 0–37)
Albumin: 4.8 g/dL (ref 3.5–5.2)
Alkaline Phosphatase: 37 U/L — ABNORMAL LOW (ref 39–117)
BUN: 10 mg/dL (ref 6–23)
CO2: 28 meq/L (ref 19–32)
Calcium: 9.6 mg/dL (ref 8.4–10.5)
Chloride: 100 meq/L (ref 96–112)
Creatinine, Ser: 0.92 mg/dL (ref 0.40–1.20)
GFR: 75.55 mL/min (ref >60.00)
Glucose, Bld: 91 mg/dL (ref 70–99)
Potassium: 3.8 meq/L (ref 3.5–5.1)
Sodium: 136 meq/L (ref 135–145)
Total Bilirubin: 1.1 mg/dL (ref 0.2–1.2)
Total Protein: 7.3 g/dL (ref 6.0–8.3)

## 2023-10-09 LAB — C-REACTIVE PROTEIN: CRP: <1 mg/dL (ref 0.5–20.0)

## 2023-10-09 LAB — TSH: TSH: 2.02 u[IU]/mL (ref 0.35–5.50)

## 2023-10-09 LAB — SEDIMENTATION RATE: Sed Rate: 8 mm/h (ref 0–20)

## 2023-10-09 NOTE — Progress Notes (Signed)
 Acute Office Visit  Subjective:     Patient ID: Terry Chang, female    DOB: Nov 07, 1980, 43 y.o.   MRN: [id number]  Chief Complaint  Patient presents with   Arm Swelling    Swelling starting around the neck/shoulders, has now spread under arm pits, and on ribs. Notes of no discoloration, slight pain/pressure in shoulders/neck. Patient has followed up with physical therapy where they performed exercises to alleviate the swelling but this did not help    HPI Patient is in today for evaluation of progressive and continued swelling, lymphadenopathy to bilateral cervical chains, clavicular chains and bilateral axilla that began about 3 months ago. Also endorses night sweats. Reports that the areas are tender, that her chest and neck feel heavy and full. Denies any redness, bruising, fever, chills, rash, other symptoms. Medical history as outlined below.  ROS Per HPI      Objective:    BP 128/84   Pulse 71   Temp 98.3 F (36.8 C)   Ht 5' 2.4" (1.585 m)   Wt 160 lb (72.6 kg)   BMI 28.89 kg/m    Physical Exam Vitals and nursing note reviewed.  Constitutional:      General: She is not in acute distress. HENT:     Head: Normocephalic and atraumatic.     Right Ear: External ear normal.     Left Ear: External ear normal.     Nose: Nose normal.     Mouth/Throat:     Mouth: Mucous membranes are moist.     Pharynx: Oropharynx is clear.  Eyes:     Extraocular Movements: Extraocular movements intact.     Pupils: Pupils are equal, round, and reactive to light.  Cardiovascular:     Rate and Rhythm: Normal rate and regular rhythm.     Pulses: Normal pulses.     Heart sounds: Normal heart sounds.  Pulmonary:     Effort: Pulmonary effort is normal. No respiratory distress.     Breath sounds: Normal breath sounds. No wheezing, rhonchi or rales.  Musculoskeletal:        General: Normal range of motion.     Cervical back: Normal range of motion.     Right lower leg: No  edema.     Left lower leg: No edema.  Lymphadenopathy:     Cervical: Cervical adenopathy present.     Right cervical: Superficial cervical adenopathy present.     Left cervical: Superficial cervical adenopathy present.     Upper Body:     Right upper body: Supraclavicular adenopathy, axillary adenopathy and pectoral adenopathy present. No epitrochlear adenopathy.     Left upper body: Supraclavicular adenopathy, axillary adenopathy and pectoral adenopathy present. No epitrochlear adenopathy.     Lower Body: No right inguinal adenopathy. No left inguinal adenopathy.  Neurological:     General: No focal deficit present.     Mental Status: She is alert and oriented to person, place, and time.  Psychiatric:        Mood and Affect: Mood normal.        Thought Content: Thought content normal.     Results for orders placed or performed in visit on 10/09/23  CBC with Differential/Platelet  Result Value Ref Range   WBC 5.6 4.0 - 10.5 K/uL   RBC 4.00 3.87 - 5.11 Mil/uL   Hemoglobin 12.9 12.0 - 15.0 g/dL   HCT 40.9 81.1 - 91.4 %   MCV 96.1 78.0 - 100.0 fl  MCHC 33.6 30.0 - 36.0 g/dL   RDW 16.1 09.6 - 04.5 %   Platelets 321.0 150.0 - 400.0 K/uL   Neutrophils Relative % 50.1 43.0 - 77.0 %   Lymphocytes Relative 41.7 12.0 - 46.0 %   Monocytes Relative 6.5 3.0 - 12.0 %   Eosinophils Relative 1.2 0.0 - 5.0 %   Basophils Relative 0.5 0.0 - 3.0 %   Neutro Abs 2.8 1.4 - 7.7 K/uL   Lymphs Abs 2.3 0.7 - 4.0 K/uL   Monocytes Absolute 0.4 0.1 - 1.0 K/uL   Eosinophils Absolute 0.1 0.0 - 0.7 K/uL   Basophils Absolute 0.0 0.0 - 0.1 K/uL  Comprehensive metabolic panel with GFR  Result Value Ref Range   Sodium 136 135 - 145 mEq/L   Potassium 3.8 3.5 - 5.1 mEq/L   Chloride 100 96 - 112 mEq/L   CO2 28 19 - 32 mEq/L   Glucose, Bld 91 70 - 99 mg/dL   BUN 10 6 - 23 mg/dL   Creatinine, Ser 4.09 0.40 - 1.20 mg/dL   Total Bilirubin 1.1 0.2 - 1.2 mg/dL   Alkaline Phosphatase 37 (L) 39 - 117 U/L   AST  40 (H) 0 - 37 U/L   ALT 82 (H) 0 - 35 U/L   Total Protein 7.3 6.0 - 8.3 g/dL   Albumin 4.8 3.5 - 5.2 g/dL   GFR 81.19 >14.78 mL/min   Calcium 9.6 8.4 - 10.5 mg/dL  C-reactive protein  Result Value Ref Range   CRP <1.0 0.5 - 20.0 mg/dL  Sedimentation rate  Result Value Ref Range   Sed Rate 8 0 - 20 mm/hr  TSH  Result Value Ref Range   TSH 2.02 0.35 - 5.50 uIU/mL        Assessment & Plan:   Lymphadenopathy -     CBC with Differential/Platelet -     Comprehensive metabolic panel with GFR -     C-reactive protein -     Sedimentation rate -     TSH -     US  AXILLA LEFT; Future -     US  AXILLA RIGHT; Future -     US  SOFT TISSUE NECK; Future -     US  CHEST SOFT TISSUE; Future  Night sweats -     TSH -     US  AXILLA LEFT; Future -     US  AXILLA RIGHT; Future -     US  SOFT TISSUE NECK; Future -     US  CHEST SOFT TISSUE; Future     No orders of the defined types were placed in this encounter.   Return if symptoms worsen or fail to improve.  Wellington Half, FNP

## 2023-10-10 ENCOUNTER — Ambulatory Visit
Admission: RE | Admit: 2023-10-10 | Discharge: 2023-10-10 | Disposition: A | Discharge status: Home / Self Care | Source: Ambulatory Visit | Attending: Family Medicine | Admitting: Family Medicine

## 2023-10-10 DIAGNOSIS — R61 Generalized hyperhidrosis: Secondary | ICD-10-CM

## 2023-10-10 DIAGNOSIS — R599 Enlarged lymph nodes, unspecified: Secondary | ICD-10-CM | POA: Diagnosis not present

## 2023-10-10 DIAGNOSIS — R591 Generalized enlarged lymph nodes: Secondary | ICD-10-CM

## 2023-10-11 ENCOUNTER — Ambulatory Visit: Payer: Self-pay | Admitting: Family Medicine

## 2023-10-11 DIAGNOSIS — R591 Generalized enlarged lymph nodes: Secondary | ICD-10-CM

## 2023-10-11 DIAGNOSIS — R9389 Abnormal findings on diagnostic imaging of other specified body structures: Secondary | ICD-10-CM

## 2023-10-12 ENCOUNTER — Encounter: Payer: Self-pay | Admitting: Family Medicine

## 2023-10-12 NOTE — Patient Instructions (Signed)
 We are checking labs today, will be in contact with any results that require further attention  Ordered an ultrasound for further evaluation.  Someone will be reaching out to get you scheduled.  I will be in contact with you once we receive results.

## 2023-10-14 ENCOUNTER — Other Ambulatory Visit: Payer: Self-pay | Admitting: Family Medicine

## 2023-10-14 DIAGNOSIS — R591 Generalized enlarged lymph nodes: Secondary | ICD-10-CM

## 2023-10-14 DIAGNOSIS — R61 Generalized hyperhidrosis: Secondary | ICD-10-CM

## 2023-10-15 ENCOUNTER — Ambulatory Visit
Admission: RE | Admit: 2023-10-15 | Discharge: 2023-10-15 | Disposition: A | Discharge status: Home / Self Care | Source: Ambulatory Visit | Attending: Family Medicine

## 2023-10-15 ENCOUNTER — Ambulatory Visit
Admission: RE | Admit: 2023-10-15 | Discharge: 2023-10-15 | Disposition: A | Discharge status: Home / Self Care | Source: Ambulatory Visit | Attending: Family Medicine | Admitting: Family Medicine

## 2023-10-15 DIAGNOSIS — R9389 Abnormal findings on diagnostic imaging of other specified body structures: Secondary | ICD-10-CM

## 2023-10-15 DIAGNOSIS — R591 Generalized enlarged lymph nodes: Secondary | ICD-10-CM

## 2023-10-15 DIAGNOSIS — R599 Enlarged lymph nodes, unspecified: Secondary | ICD-10-CM | POA: Diagnosis not present

## 2023-10-15 MED ORDER — IOPAMIDOL (ISOVUE-300) INJECTION 61%
75.0000 mL | Freq: Once | INTRAVENOUS | Status: AC | PRN
  Administered 2023-10-15: 75 mL via INTRAVENOUS

## 2023-10-17 ENCOUNTER — Encounter

## 2023-10-17 ENCOUNTER — Ambulatory Visit: Payer: Self-pay | Admitting: Family Medicine

## 2023-10-17 ENCOUNTER — Other Ambulatory Visit

## 2023-10-17 ENCOUNTER — Other Ambulatory Visit: Payer: Self-pay | Admitting: Family Medicine

## 2023-10-17 DIAGNOSIS — R591 Generalized enlarged lymph nodes: Secondary | ICD-10-CM

## 2023-10-22 ENCOUNTER — Other Ambulatory Visit: Payer: Self-pay | Admitting: Family Medicine

## 2023-10-22 ENCOUNTER — Other Ambulatory Visit: Payer: Self-pay

## 2023-10-22 DIAGNOSIS — R591 Generalized enlarged lymph nodes: Secondary | ICD-10-CM

## 2023-10-22 DIAGNOSIS — R9389 Abnormal findings on diagnostic imaging of other specified body structures: Secondary | ICD-10-CM

## 2023-10-22 DIAGNOSIS — R61 Generalized hyperhidrosis: Secondary | ICD-10-CM

## 2023-10-23 NOTE — Progress Notes (Signed)
 Erica Hau, MD  Joelle Musca Cancel biopsy order.  GY       Previous Messages    ----- Message ----- From: Benedict Kue Sent: 10/22/2023   9:15 AM EDT To: Jahmiya Guidotti; Ir Procedure Requests Subject: US  FNA Soft tissue                            Procedure : US  FNA Soft Tissue  Reason: cervical lymphadenopathy, cortical thickening of lymph nodes on US  Dx: Lymphadenopathy [R59.1 (ICD-10-CM)]; Ultrasound scan abnormal [R93.89 (ICD-10-CM)]; Night sweats [R61 (ICD-10-CM)]    History : US  soft tissue neck , US  chest soft tissue , CT chest w/ , CT soft tissue neck w/  Provider : Wellington Half, FNP  Provider contact :  [phone] (Sign In) or [phone]

## 2023-12-31 ENCOUNTER — Other Ambulatory Visit: Payer: Self-pay | Admitting: Family Medicine

## 2023-12-31 DIAGNOSIS — J4520 Mild intermittent asthma, uncomplicated: Secondary | ICD-10-CM

## 2023-12-31 DIAGNOSIS — K219 Gastro-esophageal reflux disease without esophagitis: Secondary | ICD-10-CM

## 2024-02-25 ENCOUNTER — Ambulatory Visit: Admitting: Family Medicine

## 2024-02-28 ENCOUNTER — Encounter: Payer: Self-pay | Admitting: Family Medicine

## 2024-02-28 ENCOUNTER — Ambulatory Visit: Admitting: Family Medicine

## 2024-02-28 VITALS — BP 120/86 | HR 83 | Temp 98.3°F | Ht 62.4 in | Wt 156.0 lb

## 2024-02-28 DIAGNOSIS — R102 Pelvic and perineal pain unspecified side: Secondary | ICD-10-CM

## 2024-02-28 DIAGNOSIS — R221 Localized swelling, mass and lump, neck: Secondary | ICD-10-CM | POA: Diagnosis not present

## 2024-02-28 DIAGNOSIS — R599 Enlarged lymph nodes, unspecified: Secondary | ICD-10-CM

## 2024-02-28 DIAGNOSIS — R61 Generalized hyperhidrosis: Secondary | ICD-10-CM

## 2024-02-28 DIAGNOSIS — G479 Sleep disorder, unspecified: Secondary | ICD-10-CM

## 2024-02-28 DIAGNOSIS — R4189 Other symptoms and signs involving cognitive functions and awareness: Secondary | ICD-10-CM

## 2024-02-28 DIAGNOSIS — R229 Localized swelling, mass and lump, unspecified: Secondary | ICD-10-CM | POA: Insufficient documentation

## 2024-02-28 DIAGNOSIS — R1031 Right lower quadrant pain: Secondary | ICD-10-CM

## 2024-02-28 DIAGNOSIS — R682 Dry mouth, unspecified: Secondary | ICD-10-CM | POA: Diagnosis not present

## 2024-02-28 DIAGNOSIS — L308 Other specified dermatitis: Secondary | ICD-10-CM

## 2024-02-28 DIAGNOSIS — L309 Dermatitis, unspecified: Secondary | ICD-10-CM | POA: Insufficient documentation

## 2024-02-28 DIAGNOSIS — H04123 Dry eye syndrome of bilateral lacrimal glands: Secondary | ICD-10-CM

## 2024-02-28 DIAGNOSIS — R5383 Other fatigue: Secondary | ICD-10-CM

## 2024-02-28 LAB — COMPREHENSIVE METABOLIC PANEL WITH GFR
ALT: 40 U/L — ABNORMAL HIGH (ref 0–35)
AST: 31 U/L (ref 0–37)
Albumin: 4.6 g/dL (ref 3.5–5.2)
Alkaline Phosphatase: 47 U/L (ref 39–117)
BUN: 11 mg/dL (ref 6–23)
CO2: 25 meq/L (ref 19–32)
Calcium: 9.1 mg/dL (ref 8.4–10.5)
Chloride: 102 meq/L (ref 96–112)
Creatinine, Ser: 1.05 mg/dL (ref 0.40–1.20)
GFR: 64.29 mL/min (ref >60.00)
Glucose, Bld: 83 mg/dL (ref 70–99)
Potassium: 3.9 meq/L (ref 3.5–5.1)
Sodium: 136 meq/L (ref 135–145)
Total Bilirubin: 0.9 mg/dL (ref 0.2–1.2)
Total Protein: 7.4 g/dL (ref 6.0–8.3)

## 2024-02-28 LAB — CBC WITH DIFFERENTIAL/PLATELET
Basophils Absolute: 0 K/uL (ref 0.0–0.1)
Basophils Relative: 0.6 % (ref 0.0–3.0)
Eosinophils Absolute: 0.1 K/uL (ref 0.0–0.7)
Eosinophils Relative: 1.3 % (ref 0.0–5.0)
HCT: 38.2 % (ref 36.0–46.0)
Hemoglobin: 13.1 g/dL (ref 12.0–15.0)
Lymphocytes Relative: 33.5 % (ref 12.0–46.0)
Lymphs Abs: 2.2 K/uL (ref 0.7–4.0)
MCHC: 34.2 g/dL (ref 30.0–36.0)
MCV: 95.5 fl (ref 78.0–100.0)
Monocytes Absolute: 0.3 K/uL (ref 0.1–1.0)
Monocytes Relative: 4.9 % (ref 3.0–12.0)
Neutro Abs: 3.9 K/uL (ref 1.4–7.7)
Neutrophils Relative %: 59.7 % (ref 43.0–77.0)
Platelets: 356 K/uL (ref 150.0–400.0)
RBC: 4 Mil/uL (ref 3.87–5.11)
RDW: 12.1 % (ref 11.5–15.5)
WBC: 6.5 K/uL (ref 4.0–10.5)

## 2024-02-28 LAB — SEDIMENTATION RATE: Sed Rate: 16 mm/h (ref 0–20)

## 2024-02-28 LAB — BRAIN NATRIURETIC PEPTIDE: Pro B Natriuretic peptide (BNP): 9 pg/mL (ref 0.0–100.0)

## 2024-02-28 LAB — C-REACTIVE PROTEIN: CRP: 0.7 mg/dL (ref 0.5–20.0)

## 2024-02-28 NOTE — Progress Notes (Signed)
 Acute Office Visit  Subjective:     Patient ID: Terry Chang, female    DOB: 1980/10/22, 43 y.o.   MRN: [id number]  Chief Complaint  Patient presents with   Edema    Patient states her neck has been swelling for about year. Right elbow and right side groin. Painful     HPI  Discussed the use of AI scribe software for clinical note transcription with the patient, who gave verbal consent to proceed.  History of Present Illness Terry Chang is a 43 year old female who presents with worsening neck and body swelling.  Cervical and generalized lymphadenopathy - Worsening neck swelling, initially presenting as a significant mass resembling a 'baseball', now reduced in size but spread to the contralateral side of the neck - New swelling in other areas, including a tender area in the groin initially the size of a 'golf ball', now decreased in size - Multiple small lumps present - Intermittent pain associated with swelling, especially with arm movement - No swelling in ankles or feet  Constitutional symptoms - Episodes of facial erythema over the past six weeks, often observed by her boyfriend - Occasional night sweats - Feels warm and clammy at times - Significant fatigue, previously active but now struggles to get out of bed  Neurocognitive symptoms - Worsening brain fog - Difficulty finding words during conversations  Sleep disturbance - Disrupted sleep, waking between 2:00 and 2:30 AM  Mucocutaneous symptoms - Dry mouth - Occasional dry eyes, recently using Visine - Itchy ears - Dry, flaky skin on elbows and ears, treated with psoriasis cream with some improvement  Gastrointestinal and respiratory symptoms - No nausea, vomiting, diarrhea, or constipation - No shortness of breath     ROS Per HPI      Objective:    BP 120/86 (BP Location: Left Arm, Patient Position: Sitting, Cuff Size: Normal)   Pulse 83   Temp 98.3 F (36.8 C) (Oral)   Ht 5' 2.4  (1.585 m)   Wt 156 lb (70.8 kg)   SpO2 98%   BMI 28.17 kg/m    Physical Exam Vitals and nursing note reviewed.  Constitutional:      General: She is not in acute distress.    Appearance: Normal appearance. She is normal weight.  HENT:     Head: Normocephalic and atraumatic.     Right Ear: External ear normal.     Left Ear: External ear normal.     Nose: Nose normal.     Mouth/Throat:     Mouth: Mucous membranes are moist.     Pharynx: Oropharynx is clear.  Eyes:     Extraocular Movements: Extraocular movements intact.     Pupils: Pupils are equal, round, and reactive to light.  Cardiovascular:     Rate and Rhythm: Normal rate and regular rhythm.     Pulses: Normal pulses.     Heart sounds: Normal heart sounds.  Pulmonary:     Effort: Pulmonary effort is normal. No respiratory distress.     Breath sounds: Normal breath sounds. No wheezing, rhonchi or rales.  Musculoskeletal:        General: Normal range of motion.       Arms:     Cervical back: Normal range of motion.     Right lower leg: No edema.     Left lower leg: No edema.       Legs:     Comments: Areas of swelling, tenderness. No  obvious heat, erythema, bruising  Lymphadenopathy:     Cervical: Cervical adenopathy present.     Right cervical: Deep cervical adenopathy and posterior cervical adenopathy present.     Left cervical: Deep cervical adenopathy and posterior cervical adenopathy present.     Upper Body:     Right upper body: Supraclavicular adenopathy and epitrochlear adenopathy present.     Left upper body: Supraclavicular adenopathy and epitrochlear adenopathy present.     Lower Body: Right inguinal adenopathy present.  Neurological:     General: No focal deficit present.     Mental Status: She is alert and oriented to person, place, and time.  Psychiatric:        Mood and Affect: Mood normal.        Thought Content: Thought content normal.     No results found for any visits on 02/28/24.       Assessment & Plan:   Assessment and Plan Assessment & Plan Lymphadenopathy with associated night sweats and fatigue, brain fog Persistent lymphadenopathy with worsening cervical and new inguinal swelling. Occasional night sweats and significant fatigue. Differential includes autoimmune disorders. Previous chest CT unremarkable. - Order abdominal and pelvic CT scan. - Perform autoimmune workup including inflammatory markers, anti-CCP, rheumatoid factor, and Sjogren's antibodies. - Recheck CBC to evaluate white cell count. - Check cardiac enzymes.  Pelvic pain, RLQ some will be reaching pain Intermittent pelvic pain with right groin tenderness. Differential includes hernia and other pelvic pain causes. - Include pelvic pain in the CT scan order for abdomen and pelvis.  Sleep disturbance Chronic sleep disturbance with recent exacerbation, waking between 2:00 and 2:30 AM, leading to significant fatigue. Possible stress response contributing.  Eczema Dryness and pruritus of ears and elbows, flaky and white appearance. Possible eczema or psoriasis. - Recommend using cortisone cream and vaseline together for skin dryness and pruritus.  Dry Mouth and eyes - Concern for autoimmune cause given lymphadenopathy, fatigue, sweats - labs today for eval     Orders Placed This Encounter  Procedures   CT ABDOMEN PELVIS W WO CONTRAST    Standing Status:   Future    Expiration Date:   02/27/2025    If indicated for the ordered procedure, I authorize the administration of contrast media per Radiology protocol:   Yes    Does the patient have a contrast media/X-ray dye allergy?:   No    Is patient pregnant?:   No    Preferred imaging location?:   [facility]    If indicated for the ordered procedure, I authorize the administration of oral contrast media per Radiology protocol:   Yes   CBC with Differential/Platelet    Release to patient:   Immediate [1]   Comprehensive metabolic panel with  GFR    Release to patient:   Immediate [1]   C-reactive protein   Sedimentation rate   ANA,IFA RA Diag Pnl w/rflx Tit/Patn   Rheumatoid Factor   Cyclic citrul peptide antibody, IgG ([facility])   Sjogrens syndrome-A extractable nuclear antibody   Sjogrens syndrome-B extractable nuclear antibody   B Nat Peptide     No orders of the defined types were placed in this encounter.   Return if symptoms worsen or fail to improve.  Corean LITTIE Ku, FNP

## 2024-02-28 NOTE — Addendum Note (Signed)
 Addended by: ALVIA COREAN CROME on: 02/28/2024 04:05 PM   Modules accepted: Orders

## 2024-02-28 NOTE — Patient Instructions (Addendum)
 We are checking labs today, will be in contact with any results that require further attention  Will order abdomen and pelvis CT for further evaluation and will be in contact with results as soon as I get them back.  Someone will be reaching out to get you scheduled for this  Follow-up with me for new or worsening symptoms.

## 2024-03-02 ENCOUNTER — Ambulatory Visit: Payer: Self-pay | Admitting: Family Medicine

## 2024-03-02 ENCOUNTER — Encounter: Payer: Self-pay | Admitting: Family Medicine

## 2024-03-03 LAB — SJOGRENS SYNDROME-B EXTRACTABLE NUCLEAR ANTIBODY: SSB (La) (ENA) Antibody, IgG: <1 AI

## 2024-03-03 LAB — ANA,IFA RA DIAG PNL W/RFLX TIT/PATN
Anti Nuclear Antibody (ANA): NEGATIVE
Cyclic Citrullin Peptide Ab: <16 U
Rheumatoid fact SerPl-aCnc: <10 [IU]/mL (ref <14)

## 2024-03-03 LAB — SJOGRENS SYNDROME-A EXTRACTABLE NUCLEAR ANTIBODY: SSA (Ro) (ENA) Antibody, IgG: <1 AI

## 2024-03-05 ENCOUNTER — Ambulatory Visit
Admission: RE | Admit: 2024-03-05 | Discharge: 2024-03-05 | Disposition: A | Discharge status: Home / Self Care | Source: Ambulatory Visit | Attending: Family Medicine | Admitting: Family Medicine

## 2024-03-05 DIAGNOSIS — R599 Enlarged lymph nodes, unspecified: Secondary | ICD-10-CM

## 2024-03-05 DIAGNOSIS — R1031 Right lower quadrant pain: Secondary | ICD-10-CM

## 2024-03-05 DIAGNOSIS — K760 Fatty (change of) liver, not elsewhere classified: Secondary | ICD-10-CM | POA: Diagnosis not present

## 2024-03-05 MED ORDER — IOPAMIDOL (ISOVUE-300) INJECTION 61%
100.0000 mL | Freq: Once | INTRAVENOUS | Status: AC | PRN
  Administered 2024-03-05: 100 mL via INTRAVENOUS

## 2024-03-09 ENCOUNTER — Telehealth: Payer: Self-pay

## 2024-03-09 NOTE — Telephone Encounter (Signed)
 Copied from CRM [id number]. Topic: Clinical - Lab/Test Results >> Mar 09, 2024  9:36 AM Alfonso HERO wrote: Diane from [facility] radiology calling to make sure the results of the CT scan were in the system.
